# Patient Record
Sex: Female | Born: 1965 | Race: White | Hispanic: No | Marital: Single | Smoking: Never smoker
Health system: Southern US, Community
[De-identification: ages and names within clinical notes are randomized; demographics above are authoritative.]

---

## 2018-12-31 ENCOUNTER — Encounter: Payer: Self-pay | Admitting: Family Medicine

## 2018-12-31 ENCOUNTER — Other Ambulatory Visit: Payer: Self-pay

## 2018-12-31 ENCOUNTER — Ambulatory Visit (INDEPENDENT_AMBULATORY_CARE_PROVIDER_SITE_OTHER): Payer: Managed Care, Other (non HMO) | Admitting: Family Medicine

## 2018-12-31 DIAGNOSIS — Z789 Other specified health status: Secondary | ICD-10-CM

## 2018-12-31 DIAGNOSIS — R635 Abnormal weight gain: Secondary | ICD-10-CM | POA: Diagnosis not present

## 2018-12-31 NOTE — Progress Notes (Signed)
Patient ID: Hamdi Vari, female   DOB: 1965-10-28, 53 y.o.   MRN: [id number]    Virtual Visit via video Note  This visit type was conducted due to national recommendations for restrictions regarding the COVID-19 pandemic (e.g. social distancing).  This format is felt to be most appropriate for this patient at this time.  All issues noted in this document were discussed and addressed.  No physical exam was performed (except for noted visual exam findings with Video Visits).   I connected with Debera Lat today at 10:00 AM EDT by a video enabled telemedicine application and verified that I am speaking with the correct person using two identifiers. Location patient: home Location provider: work or home office Persons participating in the virtual visit: patient, provider  I discussed the limitations, risks, security and privacy concerns of performing an evaluation and management service by telephone and the availability of in person appointments. I also discussed with the patient that there may be a patient responsible charge related to this service. The patient expressed understanding and agreed to proceed.  HPI:  Patient and I connected via video prior to establish with PCP.  Main concern is unexplained weight gain.  States she has gained 40 pounds even though she is a healthy diet.  She is a vegetarian and has been for more than 10 years.  Follows a healthy vegetarian diet with lots of beans, various fruits and vegetables, tofu.  Does eat eggs at times as well.  Tries to avoid junk foods and foods that are overly processed.  States she did used to run marathons and no longer does this, this could contribute to her weight gain however does still try to get in workouts at least a few times per week.  Patient is concerned her thyroid may be underactive.   ROS:  Constitutional: Negative for chills, fatigue and fever.  HENT: Negative for congestion, ear pain, sinus pain and sore throat.   Eyes:  Negative.   Respiratory: Negative for cough, shortness of breath and wheezing.   Cardiovascular: Negative for chest pain, palpitations and leg swelling.  Gastrointestinal: Negative for abdominal pain, diarrhea, nausea and vomiting.  Genitourinary: Negative for dysuria, frequency and urgency.  Musculoskeletal: Negative for arthralgias and myalgias.  Skin: Negative for color change, pallor and rash.  Neurological: Negative for syncope, light-headedness and headaches.  Psychiatric/Behavioral: The patient is not nervous/anxious.     Past medical, surgical, social and family history reviewed and updated accordingly in chart.  History reviewed. No pertinent past medical history.  History reviewed. No pertinent surgical history.  Family History  Problem Relation Age of Onset  . Hypertension Mother   . Cancer Mother        Dwain Sarna and Lymph Node  . Cancer Father     Social History   Tobacco Use  . Smoking status: Never Smoker  . Smokeless tobacco: Never Used  Substance Use Topics  . Alcohol use: Yes    Comment: ocassionally   No current outpatient medications on file.  EXAM:  GENERAL: alert, oriented, appears well and in no acute distress  HEENT: atraumatic, conjunttiva clear, no obvious abnormalities on inspection of external nose and ears  NECK: normal movements of the head and neck  LUNGS: on inspection no signs of respiratory distress, breathing rate appears normal, no obvious gross SOB, gasping or wheezing  CV: no obvious cyanosis  MS: moves all visible extremities without noticeable abnormality  PSYCH/NEURO: pleasant and cooperative, no obvious depression or anxiety, speech  and thought processing grossly intact  ASSESSMENT AND PLAN:  Discussed the following assessment and plan:  Unexplained weight gain-patient will come into office for complete physical exam and we will do blood work at that time to do general screening and also look for possible causes  of unexplained weight gain such as thyroid being underactive, vitamin deficiency, electrolyte imbalances.  Also discussed more physical activity, patient used to be extremely physically active and running marathons; no longer running marathons could potentially have contributed to her weight gain.   I discussed the assessment and treatment plan with the patient. The patient was provided an opportunity to ask questions and all were answered. The patient agreed with the plan and demonstrated an understanding of the instructions.   The patient was advised to call back or seek an in-person evaluation if the symptoms worsen or if the condition fails to improve as anticipated.  I provided 20 minutes of video-face-to-face time during this encounter.   Jodelle Green, FNP

## 2019-01-02 DIAGNOSIS — R635 Abnormal weight gain: Secondary | ICD-10-CM | POA: Insufficient documentation

## 2019-01-02 DIAGNOSIS — Z789 Other specified health status: Secondary | ICD-10-CM | POA: Insufficient documentation

## 2019-01-14 ENCOUNTER — Ambulatory Visit: Payer: Self-pay | Admitting: Family Medicine

## 2019-01-28 ENCOUNTER — Ambulatory Visit: Payer: Self-pay | Admitting: Family Medicine

## 2019-01-28 DIAGNOSIS — Z0289 Encounter for other administrative examinations: Secondary | ICD-10-CM

## 2019-04-01 ENCOUNTER — Encounter: Payer: Self-pay | Admitting: Family Medicine

## 2019-04-01 ENCOUNTER — Ambulatory Visit (INDEPENDENT_AMBULATORY_CARE_PROVIDER_SITE_OTHER): Payer: Managed Care, Other (non HMO) | Admitting: Family Medicine

## 2019-04-01 ENCOUNTER — Other Ambulatory Visit: Payer: Self-pay

## 2019-04-01 VITALS — BP 136/90 | HR 84 | Temp 97.9°F | Ht 69.5 in | Wt 284.8 lb

## 2019-04-01 DIAGNOSIS — Z789 Other specified health status: Secondary | ICD-10-CM | POA: Diagnosis not present

## 2019-04-01 DIAGNOSIS — Z0000 Encounter for general adult medical examination without abnormal findings: Secondary | ICD-10-CM

## 2019-04-01 DIAGNOSIS — R635 Abnormal weight gain: Secondary | ICD-10-CM

## 2019-04-01 DIAGNOSIS — R030 Elevated blood-pressure reading, without diagnosis of hypertension: Secondary | ICD-10-CM | POA: Diagnosis not present

## 2019-04-01 DIAGNOSIS — Z23 Encounter for immunization: Secondary | ICD-10-CM | POA: Diagnosis not present

## 2019-04-01 DIAGNOSIS — Z1231 Encounter for screening mammogram for malignant neoplasm of breast: Secondary | ICD-10-CM

## 2019-04-01 DIAGNOSIS — H9313 Tinnitus, bilateral: Secondary | ICD-10-CM | POA: Diagnosis not present

## 2019-04-01 DIAGNOSIS — Z1211 Encounter for screening for malignant neoplasm of colon: Secondary | ICD-10-CM

## 2019-04-01 LAB — CBC
HCT: 42.3 % (ref 36.0–46.0)
Hemoglobin: 14 g/dL (ref 12.0–15.0)
MCHC: 33.1 g/dL (ref 30.0–36.0)
MCV: 87.6 fl (ref 78.0–100.0)
Platelets: 322 10*3/uL (ref 150.0–400.0)
RBC: 4.83 Mil/uL (ref 3.87–5.11)
RDW: 14.2 % (ref 11.5–15.5)
WBC: 7.3 10*3/uL (ref 4.0–10.5)

## 2019-04-01 LAB — COMPREHENSIVE METABOLIC PANEL
ALT: 16 U/L (ref 0–35)
AST: 12 U/L (ref 0–37)
Albumin: 3.9 g/dL (ref 3.5–5.2)
Alkaline Phosphatase: 80 U/L (ref 39–117)
BUN: 10 mg/dL (ref 6–23)
CO2: 29 mEq/L (ref 19–32)
Calcium: 9.1 mg/dL (ref 8.4–10.5)
Chloride: 103 mEq/L (ref 96–112)
Creatinine, Ser: 0.63 mg/dL (ref 0.40–1.20)
GFR: 98.25 mL/min (ref >60.00)
Glucose, Bld: 107 mg/dL — ABNORMAL HIGH (ref 70–99)
Potassium: 4.3 mEq/L (ref 3.5–5.1)
Sodium: 140 mEq/L (ref 135–145)
Total Bilirubin: 0.4 mg/dL (ref 0.2–1.2)
Total Protein: 6.4 g/dL (ref 6.0–8.3)

## 2019-04-01 LAB — LIPID PANEL
Cholesterol: 154 mg/dL (ref 0–200)
HDL: 39.9 mg/dL (ref >39.00)
LDL Cholesterol: 87 mg/dL (ref 0–99)
NonHDL: 113.96
Total CHOL/HDL Ratio: 4
Triglycerides: 135 mg/dL (ref 0.0–149.0)
VLDL: 27 mg/dL (ref 0.0–40.0)

## 2019-04-01 LAB — B12 AND FOLATE PANEL
Folate: 14.2 ng/mL (ref >5.9)
Vitamin B-12: 429 pg/mL (ref 211–911)

## 2019-04-01 LAB — VITAMIN D 25 HYDROXY (VIT D DEFICIENCY, FRACTURES): VITD: 29.74 ng/mL — ABNORMAL LOW (ref 30.00–100.00)

## 2019-04-01 LAB — TSH: TSH: 1.34 u[IU]/mL (ref 0.35–4.50)

## 2019-04-01 NOTE — Progress Notes (Signed)
Subjective:    Patient ID: Holly Maynard, female    DOB: Sep 15, 1965, 53 y.o.   MRN: [id number]  HPI   Patient presents to clinic for physical exam and also to discuss unexplained weight gain, ringing in ears.  Patient states she is been a vegetarian for many years, but over the past year or so she has gained 40 pounds and does not know why.  States she was running marathons years ago, but she was sidelined due to plantar fasciitis and did gain some weight after that, she went from 135 to 175 after stopping marathons & did go over 200 pounds when she began working out less.  But over the past year, has been more conscious of calories and water intake, but continues to gain.   Patient has noticed ringing in ears more so over the last year.  Denies any injury to the ears.  States she did hit her head a few years back, but hearing seemed fine after that.  Denies ever being around loud machinery; has never served in [employer]  She will be seeing GYN for Pap smear.  Has not had a mammogram in many years.  Declines colonoscopy, but is open to Cologuard.  Sees eye doctor annually, sees dentist every 6 months.   Patient Active Problem List   Diagnosis Date Noted  . Vegetarian diet 01/02/2019  . Unexplained weight gain 01/02/2019   Social History   Tobacco Use  . Smoking status: Never Smoker  . Smokeless tobacco: Never Used  Substance Use Topics  . Alcohol use: Yes    Comment: ocassionally   History reviewed. No pertinent surgical history.   Family History  Problem Relation Age of Onset  . Hypertension Mother   . Cancer Mother        Dwain Sarna and Lymph Node  . Cancer Father      Review of Systems  Constitutional: Negative for chills, fatigue and fever.  HENT: Negative for congestion, sinus pain and sore throat. +ringing in ears   Eyes: Negative.   Respiratory: Negative for cough, shortness of breath and wheezing.   Cardiovascular: Negative for chest pain, palpitations  and leg swelling.  Gastrointestinal: Negative for abdominal pain, diarrhea, nausea and vomiting.  Genitourinary: Negative for dysuria, frequency and urgency.  Musculoskeletal: Negative for arthralgias and myalgias.  Skin: Negative for color change, pallor and rash.  Neurological: Negative for syncope, light-headedness and headaches.  Psychiatric/Behavioral: The patient is not nervous/anxious.       Objective:   Physical Exam Vitals signs and nursing note reviewed.  Constitutional:      General: She is not in acute distress.    Appearance: She is obese. She is not ill-appearing, toxic-appearing or diaphoretic.  HENT:     Head: Normocephalic and atraumatic.     Right Ear: Tympanic membrane, ear canal and external ear normal. There is no impacted cerumen.     Left Ear: Tympanic membrane, ear canal and external ear normal. There is no impacted cerumen.  Eyes:     General: No scleral icterus.    Extraocular Movements: Extraocular movements intact.     Conjunctiva/sclera: Conjunctivae normal.     Pupils: Pupils are equal, round, and reactive to light.  Neck:     Musculoskeletal: Normal range of motion and neck supple.  Cardiovascular:     Rate and Rhythm: Normal rate and regular rhythm.     Heart sounds: Normal heart sounds.  Pulmonary:     Effort: Pulmonary effort is  normal. No respiratory distress.     Breath sounds: Normal breath sounds.  Abdominal:     General: Bowel sounds are normal. There is no distension.     Palpations: Abdomen is soft. There is no mass.     Tenderness: There is no abdominal tenderness. There is no right CVA tenderness, left CVA tenderness, guarding or rebound.     Hernia: No hernia is present.  Musculoskeletal: Normal range of motion.     Right lower leg: No edema.     Left lower leg: No edema.  Skin:    General: Skin is warm and dry.     Capillary Refill: Capillary refill takes less than 2 seconds.     Coloration: Skin is not jaundiced or pale.   Neurological:     General: No focal deficit present.     Mental Status: She is alert and oriented to person, place, and time.     Cranial Nerves: No cranial nerve deficit.     Gait: Gait normal.  Psychiatric:        Mood and Affect: Mood normal.        Behavior: Behavior normal.    Today's Vitals   04/01/19 0808 04/01/19 0834  BP: (!) 140/100 136/90  Pulse: 84   Temp: 97.9 F (36.6 C)   TempSrc: Temporal   SpO2: 98%   Weight: 284 lb 12.8 oz (129.2 kg)   Height: 5' 9.5" (1.765 m)    Body mass index is 41.45 kg/m.     Assessment & Plan:    Well adult health check - Plan: CBC, Comp Met (CMET), TSH, Lipid panel, B12 and Folate Panel, VITAMIN D 25 Hydroxy (Vit-D Deficiency, Fractures)  Vegetarian diet - Plan: B12 and Folate Panel, VITAMIN D 25 Hydroxy (Vit-D Deficiency, Fractures)  Screening mammogram, encounter for - Plan: MM Digital Screening  Colon cancer screening - Plan: Cologuard  Need for immunization against influenza - Plan: Flu Vaccine QUAD 36+ mos IM  Tinnitus of both ears - Plan: Ambulatory referral to Audiology  Elevated BP without diagnosis of hypertension  Unexplained weight gain  We will get lab work for general annual screening as well as further investigation to unexplained weight gain.  Patient will get mammogram.  She will do Cologuard for colon cancer screening, declines colonoscopy.  Will be getting Pap smear via GYN.  Flu vaccine given in clinic.  We will refer to audiology for further investigation into humming/ringing in ears.  Reviewed healthy diet and regular physical activity, recommended diet full of good proteins, lots of vegetables and fruits, avoiding excess carbohydrates and sugars and good water intake.  Also encouraged regular physical activity of walking and lifting small weights.  She sees eye doctor and dentist regularly.  Always wear seatbelt in car.  Discussed safe sun practices and wearing SPF when outdoors.     Patient will return  in clinic in 2 weeks for recheck of blood pressure due to elevated reading without diagnosis of hypertension.

## 2019-04-05 ENCOUNTER — Telehealth: Payer: Self-pay | Admitting: *Deleted

## 2019-04-05 NOTE — Telephone Encounter (Signed)
I advised patient not to worry & I would call her when they were resulted to me.

## 2019-04-05 NOTE — Telephone Encounter (Signed)
Copied from [place] [id number]. Topic: General - Other >> Apr 05, 2019  2:55 PM Carolyn Stare wrote: Pt would like a call back about labs

## 2019-04-05 NOTE — Telephone Encounter (Signed)
Patient was seen Friday 11/6  I am getting to her labs, there is nothing urgent  For annual labs, 3 business days for resulting is normal  If anything ever is urgent or emergent patient will be notified immediately

## 2019-04-06 ENCOUNTER — Encounter: Payer: Self-pay | Admitting: Family Medicine

## 2021-04-25 ENCOUNTER — Encounter: Payer: Self-pay | Admitting: Adult Health

## 2021-04-25 ENCOUNTER — Ambulatory Visit: Payer: Managed Care, Other (non HMO) | Admitting: Adult Health

## 2021-04-25 ENCOUNTER — Other Ambulatory Visit: Payer: Self-pay

## 2021-04-25 ENCOUNTER — Ambulatory Visit (INDEPENDENT_AMBULATORY_CARE_PROVIDER_SITE_OTHER): Payer: Managed Care, Other (non HMO)

## 2021-04-25 VITALS — BP 148/98 | HR 85 | Temp 98.0°F | Resp 18 | Ht 69.0 in | Wt 301.2 lb

## 2021-04-25 DIAGNOSIS — G8929 Other chronic pain: Secondary | ICD-10-CM

## 2021-04-25 DIAGNOSIS — Z789 Other specified health status: Secondary | ICD-10-CM | POA: Diagnosis not present

## 2021-04-25 DIAGNOSIS — M7502 Adhesive capsulitis of left shoulder: Secondary | ICD-10-CM | POA: Insufficient documentation

## 2021-04-25 DIAGNOSIS — R635 Abnormal weight gain: Secondary | ICD-10-CM | POA: Diagnosis not present

## 2021-04-25 DIAGNOSIS — M25512 Pain in left shoulder: Secondary | ICD-10-CM

## 2021-04-25 NOTE — Progress Notes (Signed)
Subjective:    Patient ID: Holly Maynard, female    DOB: 11/19/1965, 55 y.o.   MRN: [id number]  Shoulder Pain  The pain is present in the left shoulder (pain with side movements of arm.). This is a new problem. Episode onset: one year seemed to improved, then got worse in this past summer when she was pushing on a table. There has been a history of trauma. The problem occurs intermittently. The problem has been gradually worsening. The quality of the pain is described as aching. The pain is at a severity of 4/10. The pain is mild. Associated symptoms include a limited range of motion. Pertinent negatives include no fever, inability to bear weight, itching, joint locking, joint swelling, numbness, stiffness or tingling. The symptoms are aggravated by activity (sits at desk typing.). The treatment provided moderate relief. Family history does not include gout or rheumatoid arthritis. There is no history of diabetes, gout, osteoarthritis or rheumatoid arthritis.  She does report that she hears a popping in her shoulder at times.  Denies any other injury to shoulder. Denies any neck pain or neck injury.  Patient denies any breast changes or breast lumps.  Denies any pain under her arm.  She has no cardiac symptoms to report. Patient also has a chronic concern of unexplained weight gain since her 29s. Patient  denies any fever, body aches,chills, rash, chest pain, shortness of breath, nausea, vomiting, or diarrhea.     Review of Systems  Constitutional: Negative.  Negative for activity change and fever.  HENT: Negative.    Respiratory: Negative.    Cardiovascular: Negative.   Gastrointestinal: Negative.   Genitourinary: Negative.   Musculoskeletal:  Positive for arthralgias. Negative for back pain, gait problem, gout, joint swelling, myalgias, neck pain, neck stiffness and stiffness.  Skin: Negative.  Negative for itching.  Neurological: Negative.  Negative for tingling and numbness.   Psychiatric/Behavioral: Negative.        Objective:   Physical Exam Constitutional:      General: She is not in acute distress.    Appearance: She is obese. She is not ill-appearing, toxic-appearing or diaphoretic.  HENT:     Right Ear: External ear normal.     Left Ear: External ear normal.     Nose: Nose normal.     Mouth/Throat:     Mouth: Mucous membranes are moist.  Eyes:     Conjunctiva/sclera: Conjunctivae normal.  Cardiovascular:     Rate and Rhythm: Normal rate and regular rhythm.     Pulses: Normal pulses.     Heart sounds: Normal heart sounds. No murmur heard.   No friction rub. No gallop.  Abdominal:     Palpations: Abdomen is soft.  Musculoskeletal:        General: Tenderness present. No swelling or signs of injury.     Right shoulder: Normal. Normal pulse.     Left shoulder: Tenderness present. No swelling, deformity, effusion, laceration, bony tenderness or crepitus. Decreased range of motion (lateral extension/ rotation is decreased). Normal strength. Normal pulse.     Right upper arm: Normal.     Left upper arm: Normal.     Right lower leg: No edema.     Left lower leg: No edema.  Skin:    Findings: No erythema or rash.  Neurological:     Mental Status: She is alert and oriented to person, place, and time.  Psychiatric:        Mood and Affect: Mood normal.  Behavior: Behavior normal.        Thought Content: Thought content normal.        Judgment: Judgment normal.          Assessment & Plan:   Chronic left shoulder pain - Plan: DG Shoulder Left  Vegetarian diet - Plan: VITAMIN D 25 Hydroxy (Vit-D Deficiency, Fractures), Lipid panel, B12 and Folate Panel  Unexplained weight gain - Plan: CBC with Differential/Platelet, Comprehensive metabolic panel, TSH, Hemoglobin A1c, Lipid panel  Given chronicity will consider referral to orthopedics for further work-up of chronic left shoulder pain, patient would like to wait till after x-ray to have  this done.  Patient will also schedule labs that were ordered for a transfer of care to this provider that she was previously seeing Lorine Bears who has left the practice.  Patient is advised to schedule this in the near future.  She would like to discuss weight gain as well possibly at the next visit.  Red Flags discussed. The patient was given clear instructions to go to ER or return to medical center if any red flags develop, symptoms do not improve, worsen or new problems develop. They verbalized understanding.  Advised patient call the office or your primary care doctor for an appointment if no improvement within 72 hours or if any symptoms change or worsen at any time  Advised ER or urgent Care if after hours or on weekend. Call 911 for emergency symptoms at any time.Patinet verbalized understanding of all instructions given/reviewed and treatment plan and has no further questions or concerns at this time.    Return if symptoms worsen or fail to improve, for at any time for any worsening symptoms, Go to Emergency room/ urgent care if worse.

## 2021-04-25 NOTE — Patient Instructions (Signed)
Shoulder Pain °Many things can cause shoulder pain, including: °An injury to the shoulder. °Overuse of the shoulder. °Arthritis. °The source of the pain can be: °Inflammation. °An injury to the shoulder joint. °An injury to a tendon, ligament, or bone. °Follow these instructions at home: °Pay attention to changes in your symptoms. Let your health care provider know about them. Follow these instructions to relieve your pain. °If you have a sling: °Wear the sling as told by your health care provider. Remove it only as told by your health care provider. °Loosen the sling if your fingers tingle, become numb, or turn cold and blue. °Keep the sling clean. °If the sling is not waterproof: °Do not let it get wet. Remove it to shower or bathe. °Move your arm as little as possible, but keep your hand moving to prevent swelling. °Managing pain, stiffness, and swelling ° °If directed, put ice on the painful area: °Put ice in a plastic bag. °Place a towel between your skin and the bag. °Leave the ice on for 20 minutes, 2-3 times per day. Stop applying ice if it does not help with the pain. °Squeeze a soft ball or a foam pad as much as possible. This helps to keep the shoulder from swelling. It also helps to strengthen the arm. °General instructions °Take over-the-counter and prescription medicines only as told by your health care provider. °Keep all follow-up visits as told by your health care provider. This is important. °Contact a health care provider if: °Your pain gets worse. °Your pain is not relieved with medicines. °New pain develops in your arm, hand, or fingers. °Get help right away if: °Your arm, hand, or fingers: °Tingle. °Become numb. °Become swollen. °Become painful. °Turn white or blue. °Summary °Shoulder pain can be caused by an injury, overuse, or arthritis. °Pay attention to changes in your symptoms. Let your health care provider know about them. °This condition may be treated with a sling, ice, and pain  medicines. °Contact your health care provider if the pain gets worse or new pain develops. Get help right away if your arm, hand, or fingers tingle or become numb, swollen, or painful. °Keep all follow-up visits as told by your health care provider. This is important. °This information is not intended to replace advice given to you by your health care provider. Make sure you discuss any questions you have with your health care provider. °Document Revised: 11/24/2017 Document Reviewed: 11/24/2017 °Elsevier Patient Education © 2022 [employer] ° °

## 2021-04-29 ENCOUNTER — Other Ambulatory Visit: Payer: Self-pay | Admitting: Adult Health

## 2021-04-29 DIAGNOSIS — M25512 Pain in left shoulder: Secondary | ICD-10-CM

## 2021-04-29 DIAGNOSIS — G8929 Other chronic pain: Secondary | ICD-10-CM

## 2021-04-29 DIAGNOSIS — M19012 Primary osteoarthritis, left shoulder: Secondary | ICD-10-CM

## 2021-04-29 NOTE — Progress Notes (Signed)
Orders Placed This Encounter Procedures  Ambulatory referral to Orthopedic Surgery   Referral Priority:   Routine   Referral Type:   Surgical   Referral Reason:   Specialty Services Required   Referred to Provider:   Justice Britain, MD   Requested Specialty:   Orthopedic Surgery   Number of Visits Requested:   1

## 2021-04-29 NOTE — Progress Notes (Signed)
Orders Placed This Encounter  Procedures   Ambulatory referral to Orthopedic Surgery    Referral Priority:   Routine    Referral Type:   Surgical    Referral Reason:   Specialty Services Required    Referred to Provider:   Justice Britain, MD    Requested Specialty:   Orthopedic Surgery    Number of Visits Requested:   1

## 2021-05-02 ENCOUNTER — Ambulatory Visit (INDEPENDENT_AMBULATORY_CARE_PROVIDER_SITE_OTHER): Payer: Managed Care, Other (non HMO) | Admitting: Adult Health

## 2021-05-02 ENCOUNTER — Other Ambulatory Visit: Payer: Self-pay

## 2021-05-02 ENCOUNTER — Encounter: Payer: Self-pay | Admitting: Adult Health

## 2021-05-02 VITALS — BP 132/90 | HR 68 | Temp 98.6°F | Ht 69.0 in | Wt 296.4 lb

## 2021-05-02 DIAGNOSIS — M25512 Pain in left shoulder: Secondary | ICD-10-CM

## 2021-05-02 DIAGNOSIS — I159 Secondary hypertension, unspecified: Secondary | ICD-10-CM

## 2021-05-02 DIAGNOSIS — Z6841 Body Mass Index (BMI) 40.0 and over, adult: Secondary | ICD-10-CM

## 2021-05-02 DIAGNOSIS — I10 Essential (primary) hypertension: Secondary | ICD-10-CM | POA: Diagnosis not present

## 2021-05-02 DIAGNOSIS — G8929 Other chronic pain: Secondary | ICD-10-CM | POA: Diagnosis not present

## 2021-05-02 DIAGNOSIS — Z789 Other specified health status: Secondary | ICD-10-CM

## 2021-05-02 DIAGNOSIS — R635 Abnormal weight gain: Secondary | ICD-10-CM | POA: Diagnosis not present

## 2021-05-02 LAB — CBC WITH DIFFERENTIAL/PLATELET
Basophils Absolute: 0.1 10*3/uL (ref 0.0–0.1)
Basophils Relative: 0.8 % (ref 0.0–3.0)
Eosinophils Absolute: 0.1 10*3/uL (ref 0.0–0.7)
Eosinophils Relative: 1.2 % (ref 0.0–5.0)
HCT: 44.1 % (ref 36.0–46.0)
Hemoglobin: 14.5 g/dL (ref 12.0–15.0)
Lymphocytes Relative: 21.6 % (ref 12.0–46.0)
Lymphs Abs: 1.7 10*3/uL (ref 0.7–4.0)
MCHC: 32.8 g/dL (ref 30.0–36.0)
MCV: 86.9 fl (ref 78.0–100.0)
Monocytes Absolute: 0.5 10*3/uL (ref 0.1–1.0)
Monocytes Relative: 6.3 % (ref 3.0–12.0)
Neutro Abs: 5.6 10*3/uL (ref 1.4–7.7)
Neutrophils Relative %: 70.1 % (ref 43.0–77.0)
Platelets: 364 10*3/uL (ref 150.0–400.0)
RBC: 5.08 Mil/uL (ref 3.87–5.11)
RDW: 14.2 % (ref 11.5–15.5)
WBC: 8 10*3/uL (ref 4.0–10.5)

## 2021-05-02 LAB — LIPID PANEL
Cholesterol: 154 mg/dL (ref 0–200)
HDL: 42.8 mg/dL (ref >39.00)
LDL Cholesterol: 82 mg/dL (ref 0–99)
NonHDL: 111.4
Total CHOL/HDL Ratio: 4
Triglycerides: 147 mg/dL (ref 0.0–149.0)
VLDL: 29.4 mg/dL (ref 0.0–40.0)

## 2021-05-02 LAB — VITAMIN D 25 HYDROXY (VIT D DEFICIENCY, FRACTURES): VITD: 25.87 ng/mL — ABNORMAL LOW (ref 30.00–100.00)

## 2021-05-02 LAB — COMPREHENSIVE METABOLIC PANEL
ALT: 19 U/L (ref 0–35)
AST: 15 U/L (ref 0–37)
Albumin: 4.2 g/dL (ref 3.5–5.2)
Alkaline Phosphatase: 83 U/L (ref 39–117)
BUN: 12 mg/dL (ref 6–23)
CO2: 30 mEq/L (ref 19–32)
Calcium: 10 mg/dL (ref 8.4–10.5)
Chloride: 104 mEq/L (ref 96–112)
Creatinine, Ser: 0.58 mg/dL (ref 0.40–1.20)
GFR: 100.97 mL/min (ref >60.00)
Glucose, Bld: 104 mg/dL — ABNORMAL HIGH (ref 70–99)
Potassium: 4.7 mEq/L (ref 3.5–5.1)
Sodium: 140 mEq/L (ref 135–145)
Total Bilirubin: 0.4 mg/dL (ref 0.2–1.2)
Total Protein: 6.6 g/dL (ref 6.0–8.3)

## 2021-05-02 LAB — B12 AND FOLATE PANEL
Folate: 16.4 ng/mL (ref >5.9)
Vitamin B-12: 882 pg/mL (ref 211–911)

## 2021-05-02 LAB — HEMOGLOBIN A1C: Hgb A1c MFr Bld: 6.1 % (ref 4.6–6.5)

## 2021-05-02 LAB — TSH: TSH: 1.2 u[IU]/mL (ref 0.35–5.50)

## 2021-05-02 MED ORDER — HYDROCHLOROTHIAZIDE 12.5 MG PO CAPS: 12.5000 mg | ORAL_CAPSULE | Freq: Every day | ORAL | 1 refills | Status: DC

## 2021-05-02 NOTE — Progress Notes (Signed)
Established Patient Office Visit  Subjective:  Patient ID: Holly Maynard, female    DOB: 1966-03-10  Age: 55 y.o. MRN: [id number]  CC:  Chief Complaint  Patient presents with   Establish Care    HPI Holly Maynard presents for  transfer of care from [facility] follow up chronic shoulder follow up, degenerative change in shoulder , pain for 2 years, referred to orthopedics last visit.   She is concerned with her weight. Difficult with weight loss since the age of 59's. Tired and weight hurts her legs/ knees. Not exercising due to fatigue and tiredness. She would like to try medication as she has tried multiple diets without success. The weight is hindering from exercising.    Mammogram and PAP at [facility] in [place]. Menopausal 1.5 years ago.  Cologuard 04/01/2019 results negative per patient, I can not see this.   Patient  denies any fever, body aches,chills, rash, chest pain, shortness of breath, nausea, vomiting, or diarrhea.   Denies dizziness, lightheadedness, pre syncopal or syncopal episodes.   No past medical history on file.  No past surgical history on file.  Family History  Problem Relation Age of Onset   Hypertension Mother    Cancer Mother        Dwain Sarna and Lymph Node   Cancer Father     Social History   Socioeconomic History   Marital status: Single    Spouse name: Not on file   Number of children: Not on file   Years of education: Not on file   Highest education level: Not on file  Occupational History   Not on file  Tobacco Use   Smoking status: Never   Smokeless tobacco: Never  Vaping Use   Vaping Use: Never used  Substance and Sexual Activity   Alcohol use: Yes    Comment: ocassionally   Drug use: Never   Sexual activity: Not on file  Other Topics Concern   Not on file  Social History Narrative   Not on file   Social Determinants of Health   Financial Resource Strain: Not on file  Food Insecurity: Not on file  Transportation Needs: Not  on file  Physical Activity: Not on file  Stress: Not on file  Social Connections: Not on file  Intimate Partner Violence: Not on file    No outpatient medications prior to visit.   No facility-administered medications prior to visit.    Allergies  Allergen Reactions   Benzonatate Anaphylaxis    Felt like she couldn't breathe    ROS Review of Systems  Constitutional:  Positive for fatigue. Negative for activity change, appetite change, chills, diaphoresis, fever and unexpected weight change.  HENT: Negative.    Respiratory: Negative.    Cardiovascular: Negative.   Gastrointestinal: Negative.   Genitourinary: Negative.   Musculoskeletal:  Positive for arthralgias. Negative for back pain, gait problem, joint swelling, myalgias, neck pain and neck stiffness.  Skin: Negative.   Neurological: Negative.   Psychiatric/Behavioral: Negative.       Objective:    Physical Exam Constitutional:      General: She is not in acute distress.    Appearance: She is obese. She is not ill-appearing, toxic-appearing or diaphoretic.  HENT:     Head: Normocephalic and atraumatic.     Right Ear: External ear normal.     Left Ear: External ear normal.     Nose: Nose normal.     Mouth/Throat:     Mouth: Mucous membranes  are moist.  Eyes:     Conjunctiva/sclera: Conjunctivae normal.  Neck:     Vascular: No carotid bruit.  Cardiovascular:     Rate and Rhythm: Normal rate and regular rhythm.     Pulses: Normal pulses.     Heart sounds: Normal heart sounds. No murmur heard.   No friction rub. No gallop.  Pulmonary:     Effort: Pulmonary effort is normal. No respiratory distress.     Breath sounds: Normal breath sounds. No stridor. No wheezing, rhonchi or rales.  Chest:     Chest wall: No tenderness.  Abdominal:     Palpations: Abdomen is soft.  Musculoskeletal:     Cervical back: Normal range of motion and neck supple. No rigidity or tenderness.     Comments: left shoulder chronic  pain, x ray showed mild degenerative changes has been referred to orthopedics for further evaluation. Denies any worsening symptoms.    Lymphadenopathy:     Cervical: No cervical adenopathy.  Skin:    General: Skin is warm.     Findings: No erythema or rash.  Neurological:     General: No focal deficit present.     Mental Status: She is alert and oriented to person, place, and time.     Motor: No weakness.     Gait: Gait normal.  Psychiatric:        Mood and Affect: Mood normal.        Behavior: Behavior normal.        Thought Content: Thought content normal.    BP 132/90 (BP Location: Right Arm, Patient Position: Sitting, Cuff Size: Large)   Pulse 68   Temp 98.6 F (37 C) (Oral)   Ht 5\' 9"  (1.753 m)   Wt 296 lb 6.4 oz (134.4 kg)   LMP  (LMP Unknown)   SpO2 97%   BMI 43.77 kg/m  Wt Readings from Last 3 Encounters:  05/02/21 296 lb 6.4 oz (134.4 kg)  04/25/21 (!) 301 lb 4 oz (136.6 kg)  04/01/19 284 lb 12.8 oz (129.2 kg)   Vitals with BMI 05/02/2021 04/25/2021 04/01/2019  Height 5\' 9"  5\' 9"  -  Weight 296 lbs 6 oz 301 lbs 4 oz -  BMI 12.19 75.88 -  Systolic 325 498 264  Diastolic 90 98 90  Pulse 68 85 -     Health Maintenance Due  Topic Date Due   PAP SMEAR-Modifier  Never done   COLONOSCOPY (Pts 45-30yrs Insurance coverage will need to be confirmed)  Never done   MAMMOGRAM  Never done    There are no preventive care reminders to display for this patient.  Lab Results  Component Value Date   TSH 1.34 04/01/2019   Lab Results  Component Value Date   WBC 7.3 04/01/2019   HGB 14.0 04/01/2019   HCT 42.3 04/01/2019   MCV 87.6 04/01/2019   PLT 322.0 04/01/2019   Lab Results  Component Value Date   NA 140 04/01/2019   K 4.3 04/01/2019   CO2 29 04/01/2019   GLUCOSE 107 (H) 04/01/2019   BUN 10 04/01/2019   CREATININE 0.63 04/01/2019   BILITOT 0.4 04/01/2019   ALKPHOS 80 04/01/2019   AST 12 04/01/2019   ALT 16 04/01/2019   PROT 6.4 04/01/2019   ALBUMIN 3.9  04/01/2019   CALCIUM 9.1 04/01/2019   GFR 98.25 04/01/2019   Lab Results  Component Value Date   CHOL 154 04/01/2019   Lab Results  Component Value Date  HDL 39.90 04/01/2019   Lab Results  Component Value Date   LDLCALC 87 04/01/2019   Lab Results  Component Value Date   TRIG 135.0 04/01/2019   Lab Results  Component Value Date   CHOLHDL 4 04/01/2019   No results found for: HGBA1C    Assessment & Plan:   Problem List Items Addressed This Visit       Other   Vegetarian diet   Unexplained weight gain   Chronic left shoulder pain   Body mass index (BMI) of 40.1-44.9 in adult [facility]) - Primary   Other Visit Diagnoses     Secondary hypertension       Relevant Medications   hydrochlorothiazide (MICROZIDE) 12.5 MG capsule   Primary hypertension       Relevant Medications   hydrochlorothiazide (MICROZIDE) 12.5 MG capsule       Meds ordered this encounter  Medications   hydrochlorothiazide (MICROZIDE) 12.5 MG capsule    Sig: Take 1 capsule (12.5 mg total) by mouth daily.    Dispense:  30 capsule    Refill:  1   Blood pressure has remained elevated the past 2 visits, patient will start low-dose hydrochlorothiazide.  Labs are today.  We will check labs that would be resulting in weight gain or inability to lose weight.  Discussed that if she is diabetic or prediabetic may qualify for Ozempic given information for that as well. We will wait for labs and make decision she has had multiple failed attempts at weight loss and weight is putting pressure on her knees.  Records have been requested, patient reports Cologuard 1 year ago was negative.  She does have women's health care that she is going to schedule her Pap and mammogram with.  Follow-up: Return in about 2 months (around 07/03/2021), or if symptoms worsen or fail to improve, for at any time for any worsening symptoms, Go to Emergency room/ urgent care if worse.    Marcille Buffy, FNP

## 2021-05-02 NOTE — Patient Instructions (Signed)
Aliskiren; Hydrochlorothiazide, HCTZ Tablet What is this medication? ALISKIREN; HYDROCHLOROTHIAZIDE (a lis KYE ren; hye droe klor oh THYE a zide) is a combination of a renin inhibitor and a diuretic. It treats high blood pressure. This medicine may be used for other purposes; ask your health care provider or pharmacist if you have questions. COMMON BRAND NAME(S): Tekturna HCT What should I tell my care team before I take this medication? They need to know if you have any of these conditions: dehydration diabetes gout kidney disease or kidney stones liver disease pancreatitis small amount of urine or difficulty passing urine an unusual or allergic reaction to aliskiren, hydrochlorothiazide, HCTZ, other medicines, foods, dyes, or preservatives pregnant or trying to get pregnant breast-feeding How should I use this medication? Take this medicine by mouth with a glass of water. Follow the directions on your prescription label. You can take this medicine with or without food. However, you should always take it the same way each time. Take your medicine at regular intervals. Do not take it more often than directed. Do not stop taking except on your doctor's advice. Talk to your health care provider about the use of this drug in children. Special care may be needed. Overdosage: If you think you have taken too much of this medicine contact a poison control center or emergency room at once. NOTE: This medicine is only for you. Do not share this medicine with others. What if I miss a dose? If you miss a dose, take it as soon as you can. If it is almost time for your next dose, take only that dose. Do not take double or extra doses. What may interact with this medication? alcohol atorvastatin barbiturates cholestyramine colestipol digoxin dofetilide furosemide irbesartan lithium medicines for blood pressure medicines for diabetes medicines for fungal infections like  ketoconazole medicines that relax muscles for surgery narcotic medicines for pain NSAIDs, medicines for pain and inflammation, like ibuprofen or naproxen potassium supplements steroid medicines like prednisone or cortisone This list may not describe all possible interactions. Give your health care provider a list of all the medicines, herbs, non-prescription drugs, or dietary supplements you use. Also tell them if you smoke, drink alcohol, or use illegal drugs. Some items may interact with your medicine. What should I watch for while using this medication? Visit your health care provider for regular checks on your progress. Check your blood pressure as directed. Ask your health care provider what your blood pressure should be. Also, find out when you should contact him or her. Women should inform theirhealth care providerif they wish to become pregnant or think they might be pregnant. There is a potential for serious side effects to an unborn child, especially in the second or third trimester. Talk to your health careproviderfor more information. This drug may increase your blood sugar. Ask your health care provider if changes in diet or drugs are needed if you have diabetes. If you have diabetes and take a drug known as an ACE inhibitor or ARB, do not take this drug. Talk to your health care provider if questions. Do not treat yourself for coughs, colds, or pain while you are using this drug without asking your health care provider for advice. Some drugs may increase your blood pressure. Check with your doctor or health care provider if you get an attack of severe diarrhea, nausea, and vomiting, or if you sweat a lot. The loss of too much body fluid can make it dangerous for you to  take this drug. Avoid salt substitutes unless you are told otherwise by your health care provider. Talk to your health care provider about other dietary needs. You may get drowsy or dizzy. Do not drive, use machinery, or  do anything that needs mental alertness until you know how thisdrug affects you. Do not stand or sit up quickly, especially if you are an older patient. This reduces the risk of dizzy or fainting spells. Alcohol may interfere with the effect of this drug. Avoid alcoholic drinks. Talk to your health care provider about your risk of skin cancer. You may be more at risk for skin cancer if you take this drug. Thisdrug can make you more sensitive to the sun. Keep out of the sun. If you cannot avoid being in the sun, wear protective clothing, and use sunscreen. Do not use sun lamps or tanning beds/booths. What side effects may I notice from receiving this medication? Side effects that you should report to your doctor or health care professional as soon as possible: allergic reactions like skin rash or hives, swelling of the hands, feet, face, lips, throat, or tongue breathing problems changes in vision eye pain fast, irregular heartbeat feeling faint or lightheaded, falls fever or sore throat gout pain low blood pressure muscle pain or cramps pain, tingling, numbness in the hands or feet pain or difficulty passing urine redness, blistering, peeling or loosening of the skin, including inside the mouth seizures unusually weak or tired Side effects that usually do not require medical attention (report to your doctor or health care professional if they continue or are bothersome): change in sex drive or performance cough diarrhea dizziness dry mouth flu-like symptoms headache stomach upset This list may not describe all possible side effects. Call your doctor for medical advice about side effects. You may report side effects to FDA at 1-800-FDA-1088. Where should I keep my medication? Keep out of the reach of children and pets. Store at room temperature between 20 and 25 degrees C (68 and 77 degrees F). Protect from moisture. Keep the container tightly closed. Do not throw out the packet in the  container. It keeps the medicine dry. Throw away any unused drug after the expiration date. NOTE: This sheet is a summary. It may not cover all possible information. If you have questions about this medicine, talk to your doctor, pharmacist, or health care provider.  2022 Elsevier/Gold Standard (2021-01-29 00:00:00) Wegovy/ Semaglutide Injection (Weight Management) What is this medication? SEMAGLUTIDE (SEM a GLOO tide) promotes weight loss. It may also be used to maintain weight loss. It works by decreasing appetite. Changes to diet and exercise are often combined with this medication. This medicine may be used for other purposes; ask your health care provider or pharmacist if you have questions. COMMON BRAND NAME(S): YWVPXT What should I tell my care team before I take this medication? They need to know if you have any of these conditions: Endocrine tumors (MEN 2) or if someone in your family had these tumors Eye disease, vision problems Gallbladder disease History of depression or mental health disease History of pancreatitis Kidney disease Stomach or intestine problems Suicidal thoughts, plans, or attempt; a previous suicide attempt by you or a family member Thyroid cancer or if someone in your family had thyroid cancer An unusual or allergic reaction to semaglutide, other medications, foods, dyes, or preservatives Pregnant or trying to get pregnant Breast-feeding How should I use this medication? This medication is injected under the skin. You will be taught  how to prepare and give it. Take it as directed on the prescription label. It is given once every week (every 7 days). Keep taking it unless your care team tells you to stop. It is important that you put your used needles and pens in a special sharps container. Do not put them in a trash can. If you do not have a sharps container, call your pharmacist or care team to get one. A special MedGuide will be given to you by the pharmacist  with each prescription and refill. Be sure to read this information carefully each time. This medication comes with INSTRUCTIONS FOR USE. Ask your pharmacist for directions on how to use this medication. Read the information carefully. Talk to your pharmacist or care team if you have questions. Talk to your care team about the use of this medication in children. Special care may be needed. Overdosage: If you think you have taken too much of this medicine contact a poison control center or emergency room at once. NOTE: This medicine is only for you. Do not share this medicine with others. What if I miss a dose? If you miss a dose and the next scheduled dose is more than 2 days away, take the missed dose as soon as possible. If you miss a dose and the next scheduled dose is less than 2 days away, do not take the missed dose. Take the next dose at your regular time. Do not take double or extra doses. If you miss your dose for 2 weeks or more, take the next dose at your regular time or call your care team to talk about how to restart this medication. What may interact with this medication? Insulin and other medications for diabetes This list may not describe all possible interactions. Give your health care provider a list of all the medicines, herbs, non-prescription drugs, or dietary supplements you use. Also tell them if you smoke, drink alcohol, or use illegal drugs. Some items may interact with your medicine. What should I watch for while using this medication? Visit your care team for regular checks on your progress. It may be some time before you see the benefit from this medication. Drink plenty of fluids while taking this medication. Check with your care team if you have severe diarrhea, nausea, and vomiting, or if you sweat a lot. The loss of too much body fluid may make it dangerous for you to take this medication. This medication may affect blood sugar levels. Ask your care team if changes in  diet or medications are needed if you have diabetes. If you or your family notice any changes in your behavior, such as new or worsening depression, thoughts of harming yourself, anxiety, other unusual or disturbing thoughts, or memory loss, call your care team right away. Women should inform their care team if they wish to become pregnant or think they might be pregnant. Losing weight while pregnant is not advised and may cause harm to the unborn child. Talk to your care team for more information. What side effects may I notice from receiving this medication? Side effects that you should report to your care team as soon as possible: Allergic reactions--skin rash, itching, hives, swelling of the face, lips, tongue, or throat Change in vision Dehydration--increased thirst, dry mouth, feeling faint or lightheaded, headache, dark yellow or brown urine Gallbladder problems--severe stomach pain, nausea, vomiting, fever Heart palpitations--rapid, pounding, or irregular heartbeat Kidney injury--decrease in the amount of urine, swelling of the ankles, hands,  or feet Pancreatitis--severe stomach pain that spreads to your back or gets worse after eating or when touched, fever, nausea, vomiting Thoughts of suicide or self-harm, worsening mood, feelings of depression Thyroid cancer--new mass or lump in the neck, pain or trouble swallowing, trouble breathing, hoarseness Side effects that usually do not require medical attention (report to your care team if they continue or are bothersome): Diarrhea Loss of appetite Nausea Stomach pain Vomiting This list may not describe all possible side effects. Call your doctor for medical advice about side effects. You may report side effects to FDA at 1-800-FDA-1088. Where should I keep my medication? Keep out of the reach of children and pets. Refrigeration (preferred): Store in the refrigerator. Do not freeze. Keep this medication in the original container until you  are ready to take it. Get rid of any unused medication after the expiration date. Room temperature: If needed, prior to cap removal, the pen can be stored at room temperature for up to 28 days. Protect from light. If it is stored at room temperature, get rid of any unused medication after 28 days or after it expires, whichever is first. It is important to get rid of the medication as soon as you no longer need it or it is expired. You can do this in two ways: Take the medication to a medication take-back program. Check with your pharmacy or law enforcement to find a location. If you cannot return the medication, follow the directions in the [place]. NOTE: This sheet is a summary. It may not cover all possible information. If you have questions about this medicine, talk to your doctor, pharmacist, or health care provider.  2022 Elsevier/Gold Standard (2020-08-17 00:00:00)

## 2021-05-03 ENCOUNTER — Other Ambulatory Visit: Payer: Self-pay | Admitting: Adult Health

## 2021-05-03 ENCOUNTER — Telehealth: Payer: Self-pay

## 2021-05-03 DIAGNOSIS — R7303 Prediabetes: Secondary | ICD-10-CM

## 2021-05-03 DIAGNOSIS — Z6841 Body Mass Index (BMI) 40.0 and over, adult: Secondary | ICD-10-CM

## 2021-05-03 DIAGNOSIS — I10 Essential (primary) hypertension: Secondary | ICD-10-CM

## 2021-05-03 NOTE — Progress Notes (Signed)
Orders Placed This Encounter  Procedures   AMB Referral to [facility]    Orders Placed This Encounter  Procedures   AMB Referral to [facility]    Referral Priority:   Routine    Referral Type:   Consultation    Referral Reason:   Care Coordination    Number of Visits Requested:   1

## 2021-05-03 NOTE — Progress Notes (Signed)
She continues within prediabetic range.  Glucose is elevated.  At 104. Cysts cholesterol is within normal limits.   vitamin D3 slightly low at 2000 international units  recommended once daily. CBC within normal limits.  TSH within normal limits.   Referral with pharmacist for evaluation for prediabetes and obesity to see if any options would be covered by her insurance such as [facility]

## 2021-05-03 NOTE — Chronic Care Management (AMB) (Signed)
  Care Management   Outreach Note  05/03/2021 Name: Holly Maynard MRN: [id number] DOB: 1966/04/27  Referred by: Doreen Beam, FNP Reason for referral : Care Coordination (Outreach to schedule referral with Pharm D )   An unsuccessful telephone outreach was attempted today. The patient was referred to the case management team for assistance with care management and care coordination.   Follow Up Plan:  The care management team will reach out to the patient again over the next 5 days.  If patient returns call to provider office, please advise to call [facility] at [address], [address], [address], [place] [address] Direct Dial: [phone] [email or web address]: [email or web address]

## 2021-05-13 NOTE — Chronic Care Management (AMB) (Signed)
°  Care Management   Outreach Note  05/13/2021 Name: [name] MRN: [id number] DOB: 04/25/1966  Referred by: Doreen Beam, FNP Reason for referral : Care Coordination (Outreach to schedule referral with Pharm D )   A second unsuccessful telephone outreach was attempted today. The patient was referred to the case management team for assistance with care management and care coordination.   Follow Up Plan:  A HIPAA compliant phone message was left for the patient providing contact information and requesting a return call.  The care management team will reach out to the patient again over the next 7 days.  If patient returns call to provider office, please advise to call [facility]  at [address], [address], [place], [place] [address] Direct Dial: [phone] [email or web address]: [email or web address]

## 2021-05-15 ENCOUNTER — Encounter: Payer: Self-pay | Admitting: Adult Health

## 2021-05-17 NOTE — Chronic Care Management (AMB) (Signed)
°  Care Management   Note  05/17/2021 Name: Holly Maynard MRN: [id number] DOB: Dec 30, 1965  Holly Maynard is a 55 y.o. year old female who is a primary care patient of Flinchum, Kelby Aline, FNP. I reached out to Holly Maynard by phone today in response to a referral sent by Ms. Roselynne Debruyne's primary care provider.   Ms. Lovins was given information about care management services today including:  Care management services include personalized support from designated clinical staff supervised by her physician, including individualized plan of care and coordination with other care providers 24/7 contact phone numbers for assistance for urgent and routine care needs. The patient may stop care management services at any time by phone call to the office staff.  Patient agreed to services and verbal consent obtained.   Follow up plan: Telephone appointment with care management team member scheduled for:05/24/2021  Noreene Larsson, [address], [address], [place] [address] Direct Dial: [phone] [email or web address]: [email or web address]

## 2021-05-23 ENCOUNTER — Telehealth: Payer: Self-pay | Admitting: *Deleted

## 2021-05-23 NOTE — Chronic Care Management (AMB) (Signed)
°  Care Management   Note  05/23/2021 Name: Holly Maynard MRN: [id number] DOB: 1966-05-11  Holly Maynard is a 55 y.o. year old female who is a primary care patient of Flinchum, Kelby Aline, FNP and is actively engaged with the care management team. I reached out to Debera Lat by phone today to assist with re-scheduling an initial visit with the Pharmacist  Follow up plan: Unsuccessful telephone outreach attempt made. The care management team will reach out to the patient again over the next 7 days.  If patient returns call to provider office, please advise to call [place] at [phone].  [facility]  Direct Dial: [phone]

## 2021-05-24 ENCOUNTER — Telehealth: Payer: Managed Care, Other (non HMO)

## 2021-05-24 NOTE — Chronic Care Management (AMB) (Signed)
°  Care Management   Note  05/24/2021 Name: Neka Bise MRN: [id number] DOB: [birth year]  Cortez Steelman is a 55 y.o. year old female who is a primary care patient of Flinchum, Kelby Aline, FNP and is actively engaged with the care management team.  Unique Sillas  was re-scheduled for an initial visit with the Pharmacist  Follow up plan: Telephone appointment with care management team member scheduled for: 06/21/21  [place] [facility]  Direct Dial: [phone]

## 2021-06-21 ENCOUNTER — Telehealth (INDEPENDENT_AMBULATORY_CARE_PROVIDER_SITE_OTHER): Payer: BC Managed Care – PPO | Admitting: Pharmacist

## 2021-06-21 ENCOUNTER — Other Ambulatory Visit: Payer: Self-pay | Admitting: Adult Health

## 2021-06-21 DIAGNOSIS — Z6841 Body Mass Index (BMI) 40.0 and over, adult: Secondary | ICD-10-CM

## 2021-06-21 DIAGNOSIS — R7303 Prediabetes: Secondary | ICD-10-CM | POA: Diagnosis not present

## 2021-06-21 NOTE — Progress Notes (Signed)
Chief Complaint  Patient presents with   Weight Management    Holly Maynard is a 56 y.o. year old female who was referred for medication management by their primary care provider, Flinchum, Kelby Aline, FNP. They presented for a virtual visit in the context of the COVID-19 pandemic.   I connected with Holly Maynard on 06/21/21 at 10:30 by video and verified that I am speaking with the correct person using two identifiers.   I discussed the limitations, risks, security and privacy concerns of performing an evaluation and management service by video and the availability of in person appointments. I also discussed with the patient that there may be a patient responsible charge related to this service. The patient expressed understanding and agreed to proceed.   Patient location:  work My Location:  office Persons on the video call:  patient and myself   Subjective: Obesity; Complicated by Pre-diabetes, Hypertension :  Current medications: none  Weight Management treatments previously prescribed: none  Reports active history until plantar fascitis that occurred while running a marathon. That in combination with family health complications, COVID resulted in weight gain.   Current meal patterns: Vegetarian. Very focused on incorporating appropriate proteins, vitamins, minerals. History of working with nutritionist. Denies significant issues with snacking  Current physical activity: limited by shoulder pain, knee pain due to weight. History of marathons. Working on being active at work Therapist, nutritional), speed walking. Stays as physically active as she can  Current medication access support: no concerns at this time  Hypertension:  Current medications: HCTZ 12.5 mg daily  Current meal patterns: as above  Current physical activity: as above   Hyperlipidemia/ASCVD Risk Reduction  Current lipid lowering medications: none prescription, taking fish oil OTC Medications tried in the past:  none     Objective:  Vitals with BMI 05/02/2021 04/25/2021 04/01/2019  Height 5\' 9"  5\' 9"  -  Weight 296 lbs 6 oz 301 lbs 4 oz -  BMI 69.62 95.28 -  Systolic 413 244 010  Diastolic 90 98 90  Pulse 68 85 -   Clinical ASCVD: No  The 10-year ASCVD risk score (Arnett DK, et al., 2019) is: 3%   Values used to calculate the score:     Age: 66 years     Sex: Female     Is Non-Hispanic African American: No     Diabetic: No     Tobacco smoker: No     Systolic Blood Pressure: 272 mmHg     Is BP treated: Yes     HDL Cholesterol: 42.8 mg/dL     Total Cholesterol: 154 mg/dL   Lab Results  Component Value Date   HGBA1C 6.1 05/02/2021    Lab Results  Component Value Date   CREATININE 0.58 05/02/2021   BUN 12 05/02/2021   NA 140 05/02/2021   K 4.7 05/02/2021   CL 104 05/02/2021   CO2 30 05/02/2021    Lab Results  Component Value Date   CHOL 154 05/02/2021   HDL 42.80 05/02/2021   LDLCALC 82 05/02/2021   TRIG 147.0 05/02/2021   CHOLHDL 4 05/02/2021    Medications Reviewed Today     Reviewed by De Hollingshead, RPH-CPP (Pharmacist) on 06/21/21 at 1029  Med List Status: <None>   Medication Order Taking? Sig Documenting Provider Last Dose Status Informant  Cholecalciferol (VITAMIN D) 50 MCG (2000 UT) CAPS [id number] Yes Take 2,000 Units by mouth daily. [provider] Taking Active  hydrochlorothiazide (MICROZIDE) 12.5 MG capsule [id number] Yes Take 1 capsule (12.5 mg total) by mouth daily. Flinchum, Kelby Aline, FNP Taking Active   ibuprofen (ADVIL) 200 MG tablet [id number] Yes Take 200 mg by mouth every 6 (six) hours as needed. [provider] Taking Active   Omega-3 Fatty Acids (FISH OIL) 1000 MG CAPS [id number] Yes Take 1 capsule by mouth daily. [provider] Taking Active             Assessment/Plan:   Obesity with Pre-Diabetes  - Currently unable to achieve goal weight loss of 5-10% through diet and lifestyle modifications alone -  Extensive dietary counseling including education on focus on lean proteins, fruits and vegetables, whole grains and increased fiber consumption, adequate hydration - Extensive exercise counseling including eventual goal of 150 minutes of moderate intensity exercise weekly as above - Provided motivational interviewing.  - Recommend to start GLP1 therapy as per PCP referral. Counseled on GLP1 agonists, including mechanism of action, side effects, and benefits. No documented personal or family history of medullary thyroid cancer (documented hx breast, skin, lung in mother); personal history of pancreatitis or gallbladder disease. Counseled on potential side effects of nausea, stomach upset, queasiness, constipation, and that these generally improve over time. Advised to contact our office with more severe symptoms, including nausea, diarrhea, stomach pain. Patient verbalized understanding. - Discussed logistics of access. Will start process of authorization for [facility].  - Discussed long term goals of 5-10% weight loss, reduction in risk of progression of cardiometabolic comorbidities, improvement in quality of life. Discussed that if goal weight loss not achieved, will discontinue medication - If Wegovy not covered or not beneficial, discussed potential role of metformin for pre-diabetes/insulin resistance.   Hypertension: - Currently controlled - Recommend to continue current regimen at this time. Discussed impact of weight loss on blood pressure and ASCVD risk reduction     Hyperlipidemia/ASCVD Risk Reduction: - No indication for statin therapy at this time.  - Discussed beneficial evidence for EPA compounds rather than DHA. Encouraged to consider.    Follow Up Plan: pending medication access  [name], PharmD, [place], [place] Clinical Pharmacist [employer] at [facility] [phone]

## 2021-06-21 NOTE — Progress Notes (Signed)
1. Prediabetes   2. Body mass index (BMI) of 40.1-44.9 in adult (HCC)   3. Class 3 severe obesity due to excess calories with body mass index (BMI) of 40.0 to 44.9 in adult, unspecified whether serious comorbidity present ([place])

## 2021-06-21 NOTE — Patient Instructions (Signed)
Holly Maynard,   It was great talking with you today!  We will pursue coverage of Wegovy (semaglutide) for treatment of obesity. I will let you know when I hear from your insurance.   Take care!  [name], PharmD

## 2021-06-24 ENCOUNTER — Telehealth: Payer: Self-pay | Admitting: Pharmacist

## 2021-06-24 NOTE — Telephone Encounter (Signed)
PA denied for [facility]. Will attempt an appeal. Appeal letter and documentation faxed to [facility] today. Patient notified that she would need to complete appeal form.

## 2021-06-27 ENCOUNTER — Encounter: Payer: Self-pay | Admitting: Adult Health

## 2021-06-27 ENCOUNTER — Ambulatory Visit: Payer: BC Managed Care – PPO | Admitting: Adult Health

## 2021-06-27 ENCOUNTER — Other Ambulatory Visit: Payer: Self-pay

## 2021-06-27 VITALS — BP 142/94 | HR 82 | Temp 97.9°F | Ht 69.0 in | Wt 298.0 lb

## 2021-06-27 DIAGNOSIS — X32XXXA Exposure to sunlight, initial encounter: Secondary | ICD-10-CM

## 2021-06-27 DIAGNOSIS — M7502 Adhesive capsulitis of left shoulder: Secondary | ICD-10-CM | POA: Diagnosis not present

## 2021-06-27 DIAGNOSIS — I10 Essential (primary) hypertension: Secondary | ICD-10-CM

## 2021-06-27 DIAGNOSIS — D229 Melanocytic nevi, unspecified: Secondary | ICD-10-CM

## 2021-06-27 DIAGNOSIS — R7303 Prediabetes: Secondary | ICD-10-CM

## 2021-06-27 DIAGNOSIS — Z1231 Encounter for screening mammogram for malignant neoplasm of breast: Secondary | ICD-10-CM

## 2021-06-27 DIAGNOSIS — E559 Vitamin D deficiency, unspecified: Secondary | ICD-10-CM

## 2021-06-27 DIAGNOSIS — Z1211 Encounter for screening for malignant neoplasm of colon: Secondary | ICD-10-CM

## 2021-06-27 DIAGNOSIS — Z6841 Body Mass Index (BMI) 40.0 and over, adult: Secondary | ICD-10-CM

## 2021-06-27 MED ORDER — HYDROCHLOROTHIAZIDE 25 MG PO TABS: 25.0000 mg | ORAL_TABLET | Freq: Every day | ORAL | 3 refills | Status: DC

## 2021-06-27 NOTE — Progress Notes (Signed)
Established Patient Office Visit  Subjective:  Patient ID: Holly Maynard, female    DOB: 24-Apr-1966  Age: 56 y.o. MRN: [id number]  CC:  Chief Complaint  Patient presents with   Follow-up    HPI Kathyjo Briere presents for follow up.   Hypertension- on hctz 12.5 mg daily. He notices her ankle edema has decreased, she has no shortness of breath, chest pain or abdominal pain.   She met with Elmer Picker D and had extensive dietary counseling on diet. Wegovy for weight loss. She is working on pulling back carbohydrates. She has not done denial yet.   Left chronic shoulder pain, referred to orthopedics. She received a cortisone and it was of great relief. Range of motion is improved. Adhesive capsulitis and tendonitis. Has a follow up this week.    Patient  denies any fever, body aches,chills, rash, chest pain, shortness of breath, nausea, vomiting, or diarrhea.  Denies dizziness, lightheadedness, pre syncopal or syncopal episodes.  Denies any abdominal or back pain.     History reviewed. No pertinent past medical history.  History reviewed. No pertinent surgical history.  Family History  Problem Relation Age of Onset   Hypertension Mother    Cancer Mother        Dwain Sarna and Lymph Node   Cancer Father     Social History   Socioeconomic History   Marital status: Single    Spouse name: Not on file   Number of children: Not on file   Years of education: Not on file   Highest education level: Not on file  Occupational History   Not on file  Tobacco Use   Smoking status: Never   Smokeless tobacco: Never  Vaping Use   Vaping Use: Never used  Substance and Sexual Activity   Alcohol use: Yes    Comment: ocassionally   Drug use: Never   Sexual activity: Not on file  Other Topics Concern   Not on file  Social History Narrative   Not on file   Social Determinants of Health   Financial Resource Strain: Not on file  Food Insecurity: Not on file  Transportation  Needs: Not on file  Physical Activity: Not on file  Stress: Not on file  Social Connections: Not on file  Intimate Partner Violence: Not on file    Outpatient Medications Prior to Visit  Medication Sig Dispense Refill   Cholecalciferol (VITAMIN D) 50 MCG (2000 UT) CAPS Take 2,000 Units by mouth daily.     ibuprofen (ADVIL) 200 MG tablet Take 200 mg by mouth every 6 (six) hours as needed.     Omega-3 Fatty Acids (FISH OIL) 1000 MG CAPS Take 1 capsule by mouth daily.     hydrochlorothiazide (MICROZIDE) 12.5 MG capsule Take 1 capsule (12.5 mg total) by mouth daily. 30 capsule 1   No facility-administered medications prior to visit.    Allergies  Allergen Reactions   Benzonatate Anaphylaxis    Felt like she couldn't breathe    ROS Review of Systems  Constitutional:  Negative for fatigue.  HENT: Negative.    Respiratory: Negative.    Cardiovascular: Negative.   Gastrointestinal: Negative.   Genitourinary: Negative.   Musculoskeletal:  Positive for arthralgias (left shoulder improving with steroid injection.has near follow up.). Negative for back pain, gait problem, joint swelling, myalgias, neck pain and neck stiffness.  Skin: Negative.   Neurological: Negative.   Psychiatric/Behavioral: Negative.       Objective:    Physical Exam  General: Appearance:    Obese female in no acute distress  Eyes:    PERRL, conjunctiva/corneas clear, EOM's intact       Lungs:     Clear to auscultation bilaterally, respirations unlabored  Heart:    Normal heart rate. Normal rhythm. No murmurs, rubs, or gallops.    MS:   All extremities are intact.    Neurologic:   Awake, alert, oriented x 3. No apparent focal neurological           defect.     BP (!) 142/94 (BP Location: Left Arm, Patient Position: Sitting, Cuff Size: Large)    Pulse 82    Temp 97.9 F (36.6 C) (Oral)    Ht 5' 9"  (1.753 m)    Wt 298 lb (135.2 kg)    LMP  (LMP Unknown)    SpO2 97%    BMI 44.01 kg/m  Wt Readings from Last 3  Encounters:  06/27/21 298 lb (135.2 kg)  05/02/21 296 lb 6.4 oz (134.4 kg)  04/25/21 (!) 301 lb 4 oz (136.6 kg)     Health Maintenance Due  Topic Date Due   COLONOSCOPY (Pts 45-49yr Insurance coverage will need to be confirmed)  Never done   MAMMOGRAM  Never done    There are no preventive care reminders to display for this patient.  Lab Results  Component Value Date   TSH 1.20 05/02/2021   Lab Results  Component Value Date   WBC 8.0 05/02/2021   HGB 14.5 05/02/2021   HCT 44.1 05/02/2021   MCV 86.9 05/02/2021   PLT 364.0 05/02/2021   Lab Results  Component Value Date   NA 140 05/02/2021   K 4.7 05/02/2021   CO2 30 05/02/2021   GLUCOSE 104 (H) 05/02/2021   BUN 12 05/02/2021   CREATININE 0.58 05/02/2021   BILITOT 0.4 05/02/2021   ALKPHOS 83 05/02/2021   AST 15 05/02/2021   ALT 19 05/02/2021   PROT 6.6 05/02/2021   ALBUMIN 4.2 05/02/2021   CALCIUM 10.0 05/02/2021   GFR 100.97 05/02/2021   Lab Results  Component Value Date   CHOL 154 05/02/2021   Lab Results  Component Value Date   HDL 42.80 05/02/2021   Lab Results  Component Value Date   LDLCALC 82 05/02/2021   Lab Results  Component Value Date   TRIG 147.0 05/02/2021   Lab Results  Component Value Date   CHOLHDL 4 05/02/2021   Lab Results  Component Value Date   HGBA1C 6.1 05/02/2021      Assessment & Plan:   Problem List Items Addressed This Visit       Cardiovascular and Mediastinum   Primary hypertension    06/27/21 hypertension not controlled HCTZ increased from 12.5 mg to 25 mg.  Diet and exercise, she feels she can be more active, with decreased shoulder pain since her shoulder injection.       Relevant Medications   hydrochlorothiazide (HYDRODIURIL) 25 MG tablet   Hypertension   Relevant Medications   hydrochlorothiazide (HYDRODIURIL) 25 MG tablet   Other Relevant Orders   Comprehensive metabolic panel     Musculoskeletal and Integument   Adhesive capsulitis of left  shoulder    Steroid injection at orthopedics much improved range of motion,she is doing excercies and stretches. Has orthopedics referral.       Numerous moles   Relevant Orders   Ambulatory referral to Dermatology     Other   Prediabetes   Relevant Orders  Comprehensive metabolic panel   Class 3 severe obesity due to excess calories with body mass index (BMI) of 40.0 to 44.9 in adult [facility]) - Primary    The patient is advised to begin progressive daily aerobic exercise program, follow a low fat, low cholesterol diet, attempt to lose weight, reduce salt in diet and cooking and reduce exposure to stress.      Vitamin D deficiency   Relevant Orders   VITAMIN D 25 Hydroxy (Vit-D Deficiency, Fractures)   Screening mammogram for breast cancer   Relevant Orders   MM DIGITAL SCREENING BILATERAL   Moderate sun exposure   Relevant Orders   Ambulatory referral to Dermatology    Meds ordered this encounter  Medications   hydrochlorothiazide (HYDRODIURIL) 25 MG tablet    Sig: Take 1 tablet (25 mg total) by mouth daily.    Dispense:  90 tablet    Refill:  3   Medications Discontinued During This Encounter  Medication Reason   hydrochlorothiazide (MICROZIDE) 12.5 MG capsule     Monitor blood pressure, and also keep log, discussed we will likely need to add on another antihypertensive. Parameters given for blood pressure per guidelines.    The patient is instructed to continue all medications as prescribed. Schedule followup with check out clerk upon leaving the clinic  The patient is advised to begin progressive daily aerobic exercise program, follow a low fat, low cholesterol diet, attempt to lose weight, improve dietary compliance, use calcium 1 gram daily with Vit D, continue current medications, continue current healthy lifestyle patterns, and return for routine annual checkups with Donald Jacque, Kelby Aline, FNP,  will need to schedule physical in near future.     Red Flags discussed.  The patient was given clear instructions to go to ER or return to medical center if any red flags develop, symptoms do not improve, worsen or new problems develop. They verbalized understanding.   Follow-up: Return in about 3 months (around 09/24/2021), or if symptoms worsen or fail to improve, for at any time for any worsening symptoms, Go to Emergency room/ urgent care if worse.    Marcille Buffy, FNP

## 2021-06-27 NOTE — Patient Instructions (Addendum)
Call to schedule your screening mammogram. Your orders have been placed for your exam.  Let our office know if you have questions, concerns, or any difficulty scheduling.  If normal results then yearly screening mammograms are recommended unless you notice  Changes in your breast then you should schedule a follow up office visit. If abnormal results  Further imaging will be warranted and sooner follow up as determined by the radiologist at the [facility].   [facility] at [place], [place] [address]  Main: [phone]     [place] Diet A [place] refers to food and lifestyle choices that are based on the traditions of countries located on the [place]. It focuses on eating more fruits, vegetables, whole grains, beans, nuts, seeds, and heart-healthy fats, and eating less dairy, meat, eggs, and processed foods with added sugar, salt, and fat. This way of eating has been shown to help prevent certain conditions and improve outcomes for people who have chronic diseases, like kidney disease and heart disease. What are tips for following this plan? Reading food labels Check the serving size of packaged foods. For foods such as rice and pasta, the serving size refers to the amount of cooked product, not dry. Check the total fat in packaged foods. Avoid foods that have saturated fat or trans fats. Check the ingredient list for added sugars, such as corn syrup. Shopping  Buy a variety of foods that offer a balanced diet, including: Fresh fruits and vegetables (produce). Grains, beans, nuts, and seeds. Some of these may be available in unpackaged forms or large amounts (in bulk). Fresh seafood. Poultry and eggs. Low-fat dairy products. Buy whole ingredients instead of prepackaged foods. Buy fresh fruits and vegetables in-season from local farmers markets. Buy plain frozen fruits and vegetables. If you do not have access to  quality fresh seafood, buy precooked frozen shrimp or canned fish, such as tuna, salmon, or sardines. Stock your pantry so you always have certain foods on hand, such as olive oil, canned tuna, canned tomatoes, rice, pasta, and beans. Cooking Cook foods with extra-virgin olive oil instead of using butter or other vegetable oils. Have meat as a side dish, and have vegetables or grains as your main dish. This means having meat in small portions or adding small amounts of meat to foods like pasta or stew. Use beans or vegetables instead of meat in common dishes like chili or lasagna. Experiment with different cooking methods. Try roasting, broiling, steaming, and sauting vegetables. Add frozen vegetables to soups, stews, pasta, or rice. Add nuts or seeds for added healthy fats and plant protein at each meal. You can add these to yogurt, salads, or vegetable dishes. Marinate fish or vegetables using olive oil, lemon juice, garlic, and fresh herbs. Meal planning Plan to eat one vegetarian meal one day each week. Try to work up to two vegetarian meals, if possible. Eat seafood two or more times a week. Have healthy snacks readily available, such as: Vegetable sticks with hummus. Greek yogurt. Fruit and nut trail mix. Eat balanced meals throughout the week. This includes: Fruit: 2-3 servings a day. Vegetables: 4-5 servings a day. Low-fat dairy: 2 servings a day. Fish, poultry, or lean meat: 1 serving a day. Beans and legumes: 2 or more servings a week. Nuts and seeds: 1-2 servings a day. Whole grains: 6-8 servings a day. Extra-virgin olive oil: 3-4 servings a day. Limit red meat and sweets to only a few servings  a month. Lifestyle  Cook and eat meals together with your family, when possible. Drink enough fluid to keep your urine pale yellow. Be physically active every day. This includes: Aerobic exercise like running or swimming. Leisure activities like gardening, walking, or  housework. Get 7-8 hours of sleep each night. If recommended by your health care provider, drink red wine in moderation. This means 1 glass a day for nonpregnant women and 2 glasses a day for men. A glass of wine equals 5 oz (150 mL). What foods should I eat? Fruits Apples. Apricots. Avocado. Berries. Bananas. Cherries. Dates. Figs. Grapes. Lemons. Melon. Oranges. Peaches. Plums. Pomegranate. Vegetables Artichokes. Beets. Broccoli. Cabbage. Carrots. Eggplant. Green beans. Chard. Kale. Spinach. Onions. Leeks. Peas. Squash. Tomatoes. Peppers. Radishes. Grains Whole-grain pasta. Brown rice. Bulgur wheat. Polenta. Couscous. Whole-wheat bread. [place]. Meats and other proteins Beans. Almonds. Sunflower seeds. Pine nuts. Peanuts. [place]. Salmon. Scallops. Shrimp. [place]. Tilapia. Clams. Oysters. Eggs. Poultry without skin. Dairy Low-fat milk. Cheese. Greek yogurt. Fats and oils Extra-virgin olive oil. Avocado oil. Grapeseed oil. Beverages Water. Red wine. Herbal tea. Sweets and desserts Greek yogurt with honey. Baked apples. Poached pears. Trail mix. Seasonings and condiments Basil. Cilantro. Coriander. Cumin. Mint. Parsley. Sage. Rosemary. Tarragon. Garlic. Oregano. Thyme. Pepper. Balsamic vinegar. Tahini. Hummus. Tomato sauce. Olives. Mushrooms. The items listed above may not be a complete list of foods and beverages you can eat. Contact a dietitian for more information. What foods should I limit? This is a list of foods that should be eaten rarely or only on special occasions. Fruits Fruit canned in syrup. Vegetables Deep-fried potatoes (french fries). Grains Prepackaged pasta or rice dishes. Prepackaged cereal with added sugar. Prepackaged snacks with added sugar. Meats and other proteins Beef. Pork. Lamb. Poultry with skin. Hot dogs. Berniece Salines. Dairy Ice cream. Sour cream. Whole milk. Fats and oils Butter. Canola oil. Vegetable oil. Beef fat (tallow). Lard. Beverages Juice.  Sugar-sweetened soft drinks. Beer. Liquor and spirits. Sweets and desserts Cookies. Cakes. Pies. Candy. Seasonings and condiments Mayonnaise. Pre-made sauces and marinades. The items listed above may not be a complete list of foods and beverages you should limit. Contact a dietitian for more information. Summary The Mediterranean diet includes both food and lifestyle choices. Eat a variety of fresh fruits and vegetables, beans, nuts, seeds, and whole grains. Limit the amount of red meat and sweets that you eat. If recommended by your health care provider, drink red wine in moderation. This means 1 glass a day for nonpregnant women and 2 glasses a day for men. A glass of wine equals 5 oz (150 mL). This information is not intended to replace advice given to you by your health care provider. Make sure you discuss any questions you have with your health care provider. Document Revised: 06/17/2019 Document Reviewed: 04/14/2019 Elsevier Patient Education  2022 [place]. Hypertension, Adult Hypertension is another name for high blood pressure. High blood pressure forces your heart to work harder to pump blood. This can cause problems over time. There are two numbers in a blood pressure reading. There is a top number (systolic) over a bottom number (diastolic). It is best to have a blood pressure that is below 120/80. Healthy choices can help lower your blood pressure, or you may need medicine to help lower it. What are the causes? The cause of this condition is not known. Some conditions may be related to high blood pressure. What increases the risk? Smoking. Having type 2 diabetes mellitus, high cholesterol, or both. Not  getting enough exercise or physical activity. Being overweight. Having too much fat, sugar, calories, or salt (sodium) in your diet. Drinking too much alcohol. Having long-term (chronic) kidney disease. Having a family history of high blood pressure. Age. Risk increases  with age. Race. You may be at higher risk if you are African American. Gender. Men are at higher risk than women before age 76. After age 81, women are at higher risk than men. Having obstructive sleep apnea. Stress. What are the signs or symptoms? High blood pressure may not cause symptoms. Very high blood pressure (hypertensive crisis) may cause: Headache. Feelings of worry or nervousness (anxiety). Shortness of breath. Nosebleed. A feeling of being sick to your stomach (nausea). Throwing up (vomiting). Changes in how you see. Very bad chest pain. Seizures. How is this treated? This condition is treated by making healthy lifestyle changes, such as: Eating healthy foods. Exercising more. Drinking less alcohol. Your health care provider may prescribe medicine if lifestyle changes are not enough to get your blood pressure under control, and if: Your top number is above 130. Your bottom number is above 80. Your personal target blood pressure may vary. Follow these instructions at home: Eating and drinking  If told, follow the DASH eating plan. To follow this plan: Fill one half of your plate at each meal with fruits and vegetables. Fill one fourth of your plate at each meal with whole grains. Whole grains include whole-wheat pasta, brown rice, and whole-grain bread. Eat or drink low-fat dairy products, such as skim milk or low-fat yogurt. Fill one fourth of your plate at each meal with low-fat (lean) proteins. Low-fat proteins include fish, chicken without skin, eggs, beans, and tofu. Avoid fatty meat, cured and processed meat, or chicken with skin. Avoid pre-made or processed food. Eat less than 1,500 mg of salt each day. Do not drink alcohol if: Your doctor tells you not to drink. You are pregnant, may be pregnant, or are planning to become pregnant. If you drink alcohol: Limit how much you use to: 0-1 drink a day for women. 0-2 drinks a day for men. Be aware of how much  alcohol is in your drink. In the [place] one drink equals one 12 oz bottle of beer (355 mL), one 5 oz glass of wine (148 mL), or one 1 oz glass of hard liquor (44 mL). Lifestyle  Work with your doctor to stay at a healthy weight or to lose weight. Ask your doctor what the best weight is for you. Get at least 30 minutes of exercise most days of the week. This may include walking, swimming, or biking. Get at least 30 minutes of exercise that strengthens your muscles (resistance exercise) at least 3 days a week. This may include lifting weights or doing Pilates. Do not use any products that contain nicotine or tobacco, such as cigarettes, e-cigarettes, and chewing tobacco. If you need help quitting, ask your doctor. Check your blood pressure at home as told by your doctor. Keep all follow-up visits as told by your doctor. This is important. Medicines Take over-the-counter and prescription medicines only as told by your doctor. Follow directions carefully. Do not skip doses of blood pressure medicine. The medicine does not work as well if you skip doses. Skipping doses also puts you at risk for problems. Ask your doctor about side effects or reactions to medicines that you should watch for. Contact a doctor if you: Think you are having a reaction to the medicine you are taking.  Have headaches that keep coming back (recurring). Feel dizzy. Have swelling in your ankles. Have trouble with your vision. Get help right away if you: Get a very bad headache. Start to feel mixed up (confused). Feel weak or numb. Feel faint. Have very bad pain in your: Chest. Belly (abdomen). Throw up more than once. Have trouble breathing. Summary Hypertension is another name for high blood pressure. High blood pressure forces your heart to work harder to pump blood. For most people, a normal blood pressure is less than 120/80. Making healthy choices can help lower blood pressure. If your blood pressure does not  get lower with healthy choices, you may need to take medicine. This information is not intended to replace advice given to you by your health care provider. Make sure you discuss any questions you have with your health care provider. Document Revised: 01/20/2018 Document Reviewed: 01/20/2018 Elsevier Patient Education  [place].

## 2021-06-27 NOTE — Assessment & Plan Note (Signed)
The patient is advised to begin progressive daily aerobic exercise program, follow a low fat, low cholesterol diet, attempt to lose weight, reduce salt in diet and cooking and reduce exposure to stress.

## 2021-06-27 NOTE — Assessment & Plan Note (Signed)
Steroid injection at orthopedics much improved range of motion,she is doing excercies and stretches. Has orthopedics referral.

## 2021-06-27 NOTE — Assessment & Plan Note (Signed)
06/27/21 hypertension not controlled HCTZ increased from 12.5 mg to 25 mg.  Diet and exercise, she feels she can be more active, with decreased shoulder pain since her shoulder injection.

## 2021-06-30 DIAGNOSIS — X32XXXA Exposure to sunlight, initial encounter: Secondary | ICD-10-CM | POA: Insufficient documentation

## 2021-06-30 DIAGNOSIS — Z1231 Encounter for screening mammogram for malignant neoplasm of breast: Secondary | ICD-10-CM | POA: Insufficient documentation

## 2021-06-30 DIAGNOSIS — D229 Melanocytic nevi, unspecified: Secondary | ICD-10-CM | POA: Insufficient documentation

## 2021-06-30 DIAGNOSIS — E559 Vitamin D deficiency, unspecified: Secondary | ICD-10-CM | POA: Insufficient documentation

## 2021-07-04 ENCOUNTER — Ambulatory Visit: Payer: Managed Care, Other (non HMO) | Admitting: Adult Health

## 2021-07-09 ENCOUNTER — Telehealth: Payer: Self-pay | Admitting: Pharmacist

## 2021-07-09 DIAGNOSIS — R7303 Prediabetes: Secondary | ICD-10-CM

## 2021-07-09 NOTE — Telephone Encounter (Signed)
Called [employer]. Appeal was denied, noted that Mancel Parsons is a plan exclusion.   Called patient to notify, mail box was full. Will send MyChart message. As previously discussed, we discussed use of metformin for pre-diabetes/insulin resistance.

## 2021-07-10 MED ORDER — METFORMIN HCL ER 500 MG PO TB24: 500.0000 mg | ORAL_TABLET | Freq: Two times a day (BID) | ORAL | 1 refills | Status: DC

## 2021-07-10 NOTE — Telephone Encounter (Signed)
Script sent for metformin under Dr. Derrel Nip. Follow up scheduled.

## 2021-07-10 NOTE — Addendum Note (Signed)
Addended by: De Hollingshead on: 07/10/2021 08:09 AM   Modules accepted: Orders

## 2021-07-30 ENCOUNTER — Ambulatory Visit: Payer: Self-pay | Admitting: Pharmacist

## 2021-07-30 NOTE — Chronic Care Management (AMB) (Signed)
?   Care Management  ? ?Note ? ?07/30/2021 ?Name: Taurus Willis MRN: [id number] DOB: 1965/06/24 ? ? ? ?Closing pharmacy case at this time. Patient has clinic contact information for future questions or concerns.  ? ?[name], PharmD, [place], CPP ?Clinical Pharmacist ?Therapist, music at [employer] ?[phone] ? ?

## 2021-07-30 NOTE — Patient Instructions (Signed)
Hi Meah,  ? ?Unfortunately, I am being asked to quickly transition into another role within the health system, so I am unable to keep our next appointment. Please continue to follow up with your primary care provider as scheduled.  ? ?It has been a pleasure working with you! ? ?[name], PharmD ? ?

## 2021-09-05 ENCOUNTER — Telehealth: Payer: BC Managed Care – PPO

## 2021-12-11 ENCOUNTER — Ambulatory Visit (INDEPENDENT_AMBULATORY_CARE_PROVIDER_SITE_OTHER): Payer: BC Managed Care – PPO | Admitting: Dermatology

## 2021-12-11 ENCOUNTER — Encounter: Payer: Self-pay | Admitting: Dermatology

## 2021-12-11 DIAGNOSIS — D3617 Benign neoplasm of peripheral nerves and autonomic nervous system of trunk, unspecified: Secondary | ICD-10-CM | POA: Diagnosis not present

## 2021-12-11 DIAGNOSIS — L821 Other seborrheic keratosis: Secondary | ICD-10-CM

## 2021-12-11 DIAGNOSIS — Z808 Family history of malignant neoplasm of other organs or systems: Secondary | ICD-10-CM

## 2021-12-11 DIAGNOSIS — D229 Melanocytic nevi, unspecified: Secondary | ICD-10-CM

## 2021-12-11 DIAGNOSIS — D2362 Other benign neoplasm of skin of left upper limb, including shoulder: Secondary | ICD-10-CM

## 2021-12-11 DIAGNOSIS — Z1283 Encounter for screening for malignant neoplasm of skin: Secondary | ICD-10-CM

## 2021-12-11 DIAGNOSIS — D239 Other benign neoplasm of skin, unspecified: Secondary | ICD-10-CM

## 2021-12-11 DIAGNOSIS — D485 Neoplasm of uncertain behavior of skin: Secondary | ICD-10-CM

## 2021-12-11 DIAGNOSIS — D492 Neoplasm of unspecified behavior of bone, soft tissue, and skin: Secondary | ICD-10-CM

## 2021-12-11 NOTE — Patient Instructions (Addendum)
Wound Care Instructions  Cleanse wound gently with soap and water once a day then pat dry with clean gauze. Apply a thing coat of Petrolatum (petroleum jelly, "Vaseline") over the wound (unless you have an allergy to this). We recommend that you use a new, sterile tube of Vaseline. Do not pick or remove scabs. Do not remove the yellow or white "healing tissue" from the base of the wound.  Cover the wound with fresh, clean, nonstick gauze and secure with paper tape. You may use Band-Aids in place of gauze and tape if the would is small enough, but would recommend trimming much of the tape off as there is often too much. Sometimes Band-Aids can irritate the skin.  You should call the office for your biopsy report after 1 week if you have not already been contacted.  If you experience any problems, such as abnormal amounts of bleeding, swelling, significant bruising, significant pain, or evidence of infection, please call the office immediately.  FOR ADULT SURGERY PATIENTS: If you need something for pain relief you may take 1 extra strength Tylenol (acetaminophen) AND 2 Ibuprofen (200mg each) together every 4 hours as needed for pain. (do not take these if you are allergic to them or if you have a reason you should not take them.) Typically, you may only need pain medication for 1 to 3 days.    Due to recent changes in healthcare laws, you may see results of your pathology and/or laboratory studies on MyChart before the doctors have had a chance to review them. We understand that in some cases there may be results that are confusing or concerning to you. Please understand that not all results are received at the same time and often the doctors may need to interpret multiple results in order to provide you with the best plan of care or course of treatment. Therefore, we ask that you please give us 2 business days to thoroughly review all your results before contacting the office for clarification. Should we  see a critical lab result, you will be contacted sooner.   If You Need Anything After Your Visit  If you have any questions or concerns for your doctor, please call our main line at [phone] and press option 4 to reach your doctor's medical assistant. If no one answers, please leave a voicemail as directed and we will return your call as soon as possible. Messages left after 4 pm will be answered the following business day.   You may also send us a message via MyChart. We typically respond to MyChart messages within 1-2 business days.  For prescription refills, please ask your pharmacy to contact our office. Our fax number is [phone].  If you have an urgent issue when the clinic is closed that cannot wait until the next business day, you can page your doctor at the number below.    Please note that while we do our best to be available for urgent issues outside of office hours, we are not available 24/7.   If you have an urgent issue and are unable to reach us, you may choose to seek medical care at your doctor's office, retail clinic, urgent care center, or emergency room.  If you have a medical emergency, please immediately call 911 or go to the emergency department.  Pager Numbers  - Dr. Kowalski: [phone]  - Dr. Moye: [phone]  - Dr. Stewart: [phone]  In the event of inclement weather, please call our main line at [phone]   for an update on the status of any delays or closures.  Dermatology Medication Tips: Please keep the boxes that topical medications come in in order to help keep track of the instructions about where and how to use these. Pharmacies typically print the medication instructions only on the boxes and not directly on the medication tubes.   If your medication is too expensive, please contact our office at [phone] option 4 or send us a message through MyChart.   We are unable to tell what your co-pay for medications will be in advance  as this is different depending on your insurance coverage. However, we may be able to find a substitute medication at lower cost or fill out paperwork to get insurance to cover a needed medication.   If a prior authorization is required to get your medication covered by your insurance company, please allow us 1-2 business days to complete this process.  Drug prices often vary depending on where the prescription is filled and some pharmacies may offer cheaper prices.  The website www.goodrx.com contains coupons for medications through different pharmacies. The prices here do not account for what the cost may be with help from insurance (it may be cheaper with your insurance), but the website can give you the price if you did not use any insurance.  - You can print the associated coupon and take it with your prescription to the pharmacy.  - You may also stop by our office during regular business hours and pick up a GoodRx coupon card.  - If you need your prescription sent electronically to a different pharmacy, notify our office through [facility] MyChart or by phone at [phone] option 4.     Si Usted Necesita Algo Despus de Su Visita  Tambin puede enviarnos un mensaje a travs de MyChart. Por lo general respondemos a los mensajes de MyChart en el transcurso de 1 a 2 das hbiles.  Para renovar recetas, por favor pida a su farmacia que se ponga en contacto con nuestra oficina. Nuestro nmero de fax es el [phone].  Si tiene un asunto urgente cuando la clnica est cerrada y que no puede esperar hasta el siguiente da hbil, puede llamar/localizar a su doctor(a) al nmero que aparece a continuacin.   Por favor, tenga en cuenta que aunque hacemos todo lo posible para estar disponibles para asuntos urgentes fuera del horario de oficina, no estamos disponibles las 24 horas del da, los 7 das de la semana.   Si tiene un problema urgente y no puede comunicarse con nosotros, puede optar  por buscar atencin mdica  en el consultorio de su doctor(a), en una clnica privada, en un centro de atencin urgente o en una sala de emergencias.  Si tiene una emergencia mdica, por favor llame inmediatamente al 911 o vaya a la sala de emergencias.  Nmeros de bper  - Dr. Kowalski: [phone]  - Dra. Moye: [phone]  - Dra. Stewart: [phone]  En caso de inclemencias del tiempo, por favor llame a nuestra lnea principal al [phone] para una actualizacin sobre el estado de cualquier retraso o cierre.  Consejos para la medicacin en dermatologa: Por favor, guarde las cajas en las que vienen los medicamentos de uso tpico para ayudarle a seguir las instrucciones sobre dnde y cmo usarlos. Las farmacias generalmente imprimen las instrucciones del medicamento slo en las cajas y no directamente en los tubos del medicamento.   Si su medicamento es muy caro, por favor, pngase en contacto con nuestra   oficina llamando al [phone] y presione la opcin 4 o envenos un mensaje a travs de MyChart.   No podemos decirle cul ser su copago por los medicamentos por adelantado ya que esto es diferente dependiendo de la cobertura de su seguro. Sin embargo, es posible que podamos encontrar un medicamento sustituto a menor costo o llenar un formulario para que el seguro cubra el medicamento que se considera necesario.   Si se requiere una autorizacin previa para que su compaa de seguros cubra su medicamento, por favor permtanos de 1 a 2 das hbiles para completar este proceso.  Los precios de los medicamentos varan con frecuencia dependiendo del lugar de dnde se surte la receta y alguna farmacias pueden ofrecer precios ms baratos.  El sitio web www.goodrx.com tiene cupones para medicamentos de diferentes farmacias. Los precios aqu no tienen en cuenta lo que podra costar con la ayuda del seguro (puede ser ms barato con su seguro), pero el sitio web puede darle el precio si  no utiliz ningn seguro.  - Puede imprimir el cupn correspondiente y llevarlo con su receta a la farmacia.  - Tambin puede pasar por nuestra oficina durante el horario de atencin regular y recoger una tarjeta de cupones de GoodRx.  - Si necesita que su receta se enve electrnicamente a una farmacia diferente, informe a nuestra oficina a travs de MyChart de [place] o por telfono llamando al [phone] y presione la opcin 4.  

## 2021-12-11 NOTE — Progress Notes (Signed)
   New Patient Visit  Subjective  Holly Maynard is a 56 y.o. female who presents for the following: check spots (Back, while, no symptoms, no hx of skin ca, Mother with hx of Melanoma). New patient referral from [name], FNP The patient has spots, moles and lesions to be evaluated, some may be new or changing and the patient has concerns that these could be cancer.  The following portions of the chart were reviewed this encounter and updated as appropriate:   Tobacco  Allergies  Meds  Problems  Med Hx  Surg Hx  Fam Hx     Review of Systems:  No other skin or systemic complaints except as noted in HPI or Assessment and Plan.  Objective  Well appearing patient in no apparent distress; mood and affect are within normal limits.  A focused examination was performed including back. Relevant physical exam findings are noted in the Assessment and Plan.  L med scapula Mamillated pap 1.5cm     L deltoid Firm pink/brown papulenodule with dimple sign.    Assessment & Plan   Seborrheic Keratoses - Stuck-on, waxy, tan-brown papules and/or plaques  - Benign-appearing - Discussed benign etiology and prognosis. - Observe - Call for any changes  Melanocytic Nevi - Tan-brown and/or pink-flesh-colored symmetric macules and papules - Benign appearing on exam today - Observation - Call clinic for new or changing moles - Recommend daily use of broad spectrum spf 30+ sunscreen to sun-exposed areas.    Neoplasm of skin L med scapula Epidermal / dermal shaving  Lesion diameter (cm):  1.5 Informed consent: discussed and consent obtained   Timeout: patient name, date of birth, surgical site, and procedure verified   Procedure prep:  Patient was prepped and draped in usual sterile fashion Prep type:  Isopropyl alcohol Anesthesia: the lesion was anesthetized in a standard fashion   Anesthetic:  1% lidocaine w/ epinephrine 1-100,000 buffered w/ 8.4% NaHCO3 Instrument used:  flexible razor blade   Hemostasis achieved with: pressure, aluminum chloride and electrodesiccation   Outcome: patient tolerated procedure well   Post-procedure details: sterile dressing applied and wound care instructions given   Dressing type: bandage, petrolatum and bacitracin    Specimen 1 - Surgical pathology Differential Diagnosis: D48.5 Irritated Nevus r/o Atypia Check Margins: yes Mamillated pap 1.5cm  Dermatofibroma L deltoid A dermatofibroma is a benign growth possibly related to trauma, such as an insect bite or inflamed acne-type bump.  Discussed removal (shave vrs excision) with resulting scar and risk of recurrence.  Since not bothersome, will observe for now. Benign, observe  Return if symptoms worsen or fail to improve.  I, Holly Maynard, RMA, am acting as scribe for Sarina Ser, MD . Documentation: I have reviewed the above documentation for accuracy and completeness, and I agree with the above.  Sarina Ser, MD

## 2021-12-16 ENCOUNTER — Telehealth: Payer: Self-pay

## 2021-12-16 NOTE — Telephone Encounter (Signed)
-----   Message from Ralene Bathe, MD sent at 12/12/2021  5:48 PM EDT ----- Diagnosis Skin , left med scapula NEUROFIBROMA, BASE INVOLVED  Benign neurofibroma = "mole" No further treatment needed

## 2021-12-16 NOTE — Telephone Encounter (Signed)
Unable to leave a message voicemail box full.

## 2021-12-17 ENCOUNTER — Telehealth: Payer: Self-pay

## 2021-12-17 NOTE — Telephone Encounter (Signed)
-----   Message from Ralene Bathe, MD sent at 12/12/2021  5:48 PM EDT ----- Diagnosis Skin , left med scapula NEUROFIBROMA, BASE INVOLVED  Benign neurofibroma = "mole" No further treatment needed

## 2021-12-17 NOTE — Telephone Encounter (Signed)
Voicemail box full unable to leave a message.  

## 2021-12-21 ENCOUNTER — Encounter: Payer: Self-pay | Admitting: Dermatology

## 2021-12-26 ENCOUNTER — Telehealth: Payer: Self-pay

## 2021-12-26 NOTE — Telephone Encounter (Signed)
Left voicemail to return my call. Letter sent to patient.

## 2021-12-26 NOTE — Telephone Encounter (Signed)
-----   Message from Ralene Bathe, MD sent at 12/12/2021  5:48 PM EDT ----- Diagnosis Skin , left med scapula NEUROFIBROMA, BASE INVOLVED  Benign neurofibroma = "mole" No further treatment needed

## 2022-01-24 ENCOUNTER — Other Ambulatory Visit: Payer: Self-pay | Admitting: Internal Medicine

## 2022-01-24 DIAGNOSIS — R7303 Prediabetes: Secondary | ICD-10-CM

## 2022-01-28 ENCOUNTER — Other Ambulatory Visit: Payer: Self-pay

## 2022-01-28 DIAGNOSIS — R7303 Prediabetes: Secondary | ICD-10-CM

## 2022-01-28 MED ORDER — METFORMIN HCL ER 500 MG PO TB24: ORAL_TABLET | ORAL | 0 refills | Status: DC

## 2022-04-08 DIAGNOSIS — M7542 Impingement syndrome of left shoulder: Secondary | ICD-10-CM | POA: Diagnosis not present

## 2022-04-14 DIAGNOSIS — M25512 Pain in left shoulder: Secondary | ICD-10-CM | POA: Diagnosis not present

## 2022-04-21 ENCOUNTER — Ambulatory Visit (INDEPENDENT_AMBULATORY_CARE_PROVIDER_SITE_OTHER): Payer: BC Managed Care – PPO | Admitting: Family Medicine

## 2022-04-21 ENCOUNTER — Encounter: Payer: Self-pay | Admitting: Family Medicine

## 2022-04-21 VITALS — Ht 69.0 in | Wt 285.8 lb

## 2022-04-21 DIAGNOSIS — R7303 Prediabetes: Secondary | ICD-10-CM | POA: Diagnosis not present

## 2022-04-21 DIAGNOSIS — Z6841 Body Mass Index (BMI) 40.0 and over, adult: Secondary | ICD-10-CM | POA: Diagnosis not present

## 2022-04-21 NOTE — Patient Instructions (Addendum)
Behavioral Goals:  1. Eat at least 3 REAL meals and 0-1 snack per day.  Eat breakfast within one hour of getting up.  Aim for no more than 5 hours between eating.   A REAL meal includes at least some protein, some starch, and vegetables and/or fruit.   (OR: Would you serve this to a guest in your home, and call it a meal?)    - Ideally, for each lunch and dinner, you will have twice the volume of veg's as either protein or starch.      - Beans are the classic vegetarian protein source.      - Keep frozen veg's on hand.      - Make good use of soups.  Even if you use canned soup, first microwave veg's, then add the soup, and reheat.    2. Have at least 8 home-prepared lunches and dinners each week.    3. Walk 30 minutes 5 times a week AND do daily PT exercises as prescribed.    Document progress on your above goals using the form provided today.  (Bring your form to follo-up.)   Yogurt:  Instead of using non-caloric sweeteners, mix plain with flavored, sweetened yogurt.    Book recommendation: Breath by Cain Saupe  Follow-up appt on Tuesday, Dec 12 at 10 AM.

## 2022-04-21 NOTE — Progress Notes (Signed)
Medical Nutrition Therapy Appt start time: 1430 end time: 7680 (1 hour) Primary concerns today: Weight management.  PCP [facility], [place], [address]   Relevant history/background: Holly Maynard was seen by me for MNT many years ago for anorexia nervosa, which she said resolved in her 80's.  She presents today for weight management (06-27-21: wt 298 lb; BMI 40; ht 69").  Other diagnoses include prediabetes and HTN.  Follows a lacto-ovo vegetarian diet.  Holly Maynard gained weight during her last job with [employer] while also helping care for her mom who had cancer.  She then gained ~80 lb in the 5 months after her mom died.  She has been on sabbatical from her job since 02/04/22, so is now able to focus time and energy on self-care.    Assessment:  Holly Maynard is interested in overall health rather than just weight management.  Learning Readiness: Change in progress:  Has been moving more, and is more mindful with respect to food choices.     Usual eating pattern: 3 meals and 0-1 snack per day. Frequent foods and beverages: water, 24-32 oz diet Coke, juice; salads, fruit, rice, whwh bread, yogurt, beans, quinoa, popcorn. Morningstar breakfast patties.  Avoided foods: meat and flesh foods, salty foods, butter, margarine, beets, Br sprouts.   Usual physical activity: Walks 30 min 5 X wk (since October 2023).  Recently started PT for L shoulder, and will be doing dailyband exercises.   Sleep: Estimates she gets 7-7 1/2 hrs per night.  Impinged nerve on L shoulder has made sleep more difficult, but doing better since recent injxn.    24-hr recall: (Up at 8 AM) B (9:30 AM)-   1 thin wrap, 1 Morningstar veggie patty, 1 banana, water Snk ( AM)-   water L (1 PM)-  3-chs ravioli (360 kcal) frozen meal, diet Coke Snk (3 PM)-  1 handful cashews (240 kcal), water  D (6 PM)-  1 c blk beans&rice frozen meal (340 kcal), light sour cream (35 kcal, diet Coke  Snk ( PM)-  water Typical day? Yes.    Sometimes has salad, and often has fruit 2 X day.    Nutritional Diagnosis:  NI-5.8.3 Inappropriate intake of types of carbohydrates (specify):   As related to inadequate vegetable intake.  As evidenced by frequent intake of frozen meals and few veg's.  Handouts given during visit include: After-Visit Summary (AVS) Blank goals tracker form.   Demonstrated degree of understanding via:  Teach Back  Barriers to learning/adherence to lifestyle change: Longstanding dietary habits (although may be offset by patient's apparent motivation for change.)  Monitoring/Evaluation:  Dietary intake, exercise, and body weight in 2 week(s).

## 2022-05-05 DIAGNOSIS — M25512 Pain in left shoulder: Secondary | ICD-10-CM | POA: Diagnosis not present

## 2022-05-05 NOTE — Progress Notes (Unsigned)
Medical Nutrition Therapy Appt start time: 1000 end time: 1100 (1 hour) Primary concerns today: Weight management.  PCP [facility], [place], [address]   Relevant history/background: Gearldine was seen by me for MNT many years ago for anorexia nervosa, which she said resolved in her 29's.  She presents today for weight management (06-27-21: wt 298 lb; BMI 40; ht 69").  Other diagnoses include prediabetes and HTN.  Follows a lacto-ovo vegetarian diet.  Lavida gained weight during her last job with [employer] while also helping care for her mom who had cancer.  She then gained ~80 lb in the 5 months after her mom died.  She has been on sabbatical from her job since 02/04/22, so is now able to focus time and energy on self-care.    Assessment:  Lashan has been documented progress on her goals. She has cooked more meals at home, which she has found satisfying.  She's also been consistent with both PT exercises and walking 30 min 5 X wk.  Learning Readiness: Change in progress:  Has been moving more, and is more mindful with respect to food choices.     Weight: 282.4 lb; 285.8 lb on 04/21/22 (Ht is 69").   Usual eating pattern: 3 meals and 0-1 snack per day. Avoided foods: meat and flesh foods, salty foods, butter, margarine, beets, Br sprouts.   Usual physical activity: Walks 30 min 5 X wk (since October 2023).  PT exercises for L shoulder, twice daily.   Sleep: Estimates she gets 7-7 1/2 hrs per night.    24-hr recall:  (Up at 7:30 AM) B (8 AM)-  2 scrmbld eggs, 1 toast, 1/2 tomato, 1 banana, water Snk ( AM)-   L (12:30 PM)-  1 side salad, 1/2 c walnuts, 1 c peas, 1 tbsp low-fat vinaigrette, 1 baked potato, lite sour crm, 1/2 c frzn fruit, water,  Snk ( PM)-  Diet Coke, water D (6 PM)-  1 c wild rice, 1 1/2 c blk beans, 1/4 c shredded chs, 1/2 c frzn fruit, water Snk ( PM)-  1 frzn yogurt bar (100 kcal) Typical day? Yes.     Nutritional Diagnosis: Progress noted on NI-5.8.3  Inappropriate intake of types of carbohydrates (specify):   As related to inadequate vegetable intake.  As evidenced by meeting behavioral goals each of the past two weeks.   Handouts given during visit include: After-Visit Summary (AVS) Goals tracker form   Demonstrated degree of understanding via:  Teach Back  Barriers to learning/adherence to lifestyle change: Longstanding dietary habits (although may be offset by patient's apparent motivation for change.)  Monitoring/Evaluation:  Dietary intake, exercise, and body weight in 6 week(s).

## 2022-05-06 ENCOUNTER — Encounter: Payer: Self-pay | Admitting: Family Medicine

## 2022-05-06 ENCOUNTER — Ambulatory Visit (INDEPENDENT_AMBULATORY_CARE_PROVIDER_SITE_OTHER): Payer: BC Managed Care – PPO | Admitting: Family Medicine

## 2022-05-06 VITALS — Ht 69.0 in | Wt 282.4 lb

## 2022-05-06 DIAGNOSIS — Z6841 Body Mass Index (BMI) 40.0 and over, adult: Secondary | ICD-10-CM

## 2022-05-06 DIAGNOSIS — R7303 Prediabetes: Secondary | ICD-10-CM

## 2022-05-06 NOTE — Patient Instructions (Addendum)
Book recommendation for Holly Maynard: The Tender Bar by [employer].    Tap water: Try pouring water, then let it sit uncovered overnight.    Behavioral Goals:  1. Eat at least 3 REAL meals and 0-1 snack per day.  Eat breakfast within one hour of getting up.  Aim for no more than 5 hours between eating.   A REAL meal includes at least some protein, some starch, and vegetables and/or fruit.   (OR: Would you serve this to a guest in your home, and call it a meal?)    - Ideally, for each lunch and dinner, you will have twice the volume of veg's as either protein or starch.   2. Have at least 8 home-prepared lunches and dinners each week.    3. Walk 30 minutes 5 times a week AND do daily PT exercises as prescribed.    Document progress on your above goals using the form provided today.   Suggestion:  To document your REAL meals and home-prep'd meals, write in L or D for lunch or dinner, and record # of minutes you walk.  (And bring your form to follow-up.)      Follow-up appt on Monday, January 22 at 11 AM.

## 2022-05-12 DIAGNOSIS — M25512 Pain in left shoulder: Secondary | ICD-10-CM | POA: Diagnosis not present

## 2022-05-27 DIAGNOSIS — M25512 Pain in left shoulder: Secondary | ICD-10-CM | POA: Diagnosis not present

## 2022-06-02 DIAGNOSIS — M25512 Pain in left shoulder: Secondary | ICD-10-CM | POA: Diagnosis not present

## 2022-06-09 DIAGNOSIS — M25512 Pain in left shoulder: Secondary | ICD-10-CM | POA: Diagnosis not present

## 2022-06-16 ENCOUNTER — Encounter: Payer: Self-pay | Admitting: Family Medicine

## 2022-06-16 ENCOUNTER — Ambulatory Visit (INDEPENDENT_AMBULATORY_CARE_PROVIDER_SITE_OTHER): Payer: BC Managed Care – PPO | Admitting: Family Medicine

## 2022-06-16 VITALS — Ht 69.0 in | Wt 276.2 lb

## 2022-06-16 DIAGNOSIS — R7303 Prediabetes: Secondary | ICD-10-CM

## 2022-06-16 NOTE — Patient Instructions (Signed)
Behavioral Goals:  1. Eat at least 3 REAL meals and 0-1 snack per day.  Eat breakfast within one hour of getting up.  Aim for no more than 5 hours between eating.   A REAL lunch or dinner includes at least some protein, some starch, and vegetables.      - Ideally, for each lunch and dinner, you'll have twice the volume of veg's as protein or starch foods.  2. Have at least 8 home-prepared lunches and dinners each week.   3. Walk 30 minutes 5 times a week AND do daily PT exercises as prescribed.    Now while you have some available time, you may want to explore some new recipes.    Document progress on your above goals using the form provided today.    Follow-up appt on Monday, Feb 19 at 1:30 PM.

## 2022-06-16 NOTE — Progress Notes (Signed)
Medical Nutrition Therapy Appt start time: 1100 end time: 2376 (45 minutes) Primary concerns today: Weight management.  PCP [facility], [place], [address]   Relevant history/background: Holly Maynard was seen by me for MNT many years ago for anorexia nervosa, which she said resolved in her 5's.  She presents today for weight management (06-27-21: wt 298 lb; BMI 40; ht 69").  Other diagnoses include prediabetes and HTN.  Follows a lacto-ovo vegetarian diet.  Holly Maynard gained weight during her last job with [employer] while also helping care for her mom who had cancer.  She then gained ~80 lb in the 5 months after her mom died.  She has been on sabbatical from her job since 02/04/22, so is now able to focus time and energy on self-care.    Assessment:  Holly Maynard received an injection in her L shoulder for impingement on 12/29, which has diminished pain and increased ROM.  She has gone to weekly PT 12 X and is doing home 30 minutes of PT exercises 4 X wk.  She has consistently documented progress on her behavioral goals, which shows good adherence to recommended goals.  Holly Maynard said she has not found the food recommendations difficult to follow; is satisfied with food choices, and notices feeling better overall.     Weight: 276.2 lb; 282.4 lb on 05/06/22 (Ht is 69").   Usual eating pattern: 3 meals and 0-1 snack per day. Avoided foods: meat and flesh foods, salty foods, butter, margarine, beets, Br sprouts.   Usual physical activity: Walks 30 min 5-6 X wk (since October 2023).  ~30 min PT exercises for L shoulder, 4 X wk.  Also tries to be intermittently active.   Sleep: Estimates she gets 7-7 1/2 hrs per night.  Doing a bit better now that shoulder is less painful.    24-hr recall:  (Up at 7:30 AM) B (8 AM)-  1 scrmbld egg, 1 slc toast, 1/2 tomato, 1 c berries, water. 1 c coffee  Snk ( AM)-   L (1 PM)-  1 large spinach salad, 1 c green peas, 1 tbsp chs, 1 tbsp drsng, 1 banana, water,  Snk (3  PM)-  Diet Coke D (6 PM)-  1 1/2 c veg-based casserole (lima beans, corn, grn beans, onion, chs), water, 1 c fruit Snk (8:30)-  1 100-kcal snack pack of popcorn, water Typical day? Yes.     Nutritional Diagnosis: Continued progress noted on NI-5.8.3 Inappropriate intake of types of carbohydrates (specify):   As related to inadequate vegetable intake.  As evidenced by meeting most behavioral goals over the past several weeks.   Handouts given during visit include: After-Visit Summary (AVS) Goals tracker form   Demonstrated degree of understanding via:  Teach Back  Barriers to learning/adherence to lifestyle change: Longstanding dietary habits (although may be offset by patient's apparent motivation for change.)  Monitoring/Evaluation:  Dietary intake, exercise, and body weight in 4 week(s).

## 2022-07-14 ENCOUNTER — Ambulatory Visit: Payer: BC Managed Care – PPO | Admitting: Family Medicine

## 2022-08-06 ENCOUNTER — Encounter: Payer: Self-pay | Admitting: Family Medicine

## 2022-08-18 ENCOUNTER — Telehealth: Payer: Self-pay

## 2022-08-18 NOTE — Telephone Encounter (Signed)
We received a prescription refill request from [facility] via fax for Hydrochlorothiazide.  The fax is addressed to [facility].  I called patient to find out if she would like to continue to receive primary care from [facility] or if she is planning to receive primary care elsewhere. Patient states she is planning to stay with [facility], but she would like to call us back later this week to reschedule her TOC appointment.

## 2022-10-29 ENCOUNTER — Ambulatory Visit: Payer: BC Managed Care – PPO | Admitting: Family Medicine

## 2022-10-29 ENCOUNTER — Encounter: Payer: Self-pay | Admitting: Family Medicine

## 2022-10-29 VITALS — BP 152/88 | HR 80 | Ht 69.0 in | Wt 255.0 lb

## 2022-10-29 DIAGNOSIS — Z114 Encounter for screening for human immunodeficiency virus [HIV]: Secondary | ICD-10-CM

## 2022-10-29 DIAGNOSIS — E559 Vitamin D deficiency, unspecified: Secondary | ICD-10-CM | POA: Diagnosis not present

## 2022-10-29 DIAGNOSIS — I10 Essential (primary) hypertension: Secondary | ICD-10-CM

## 2022-10-29 DIAGNOSIS — Z789 Other specified health status: Secondary | ICD-10-CM

## 2022-10-29 DIAGNOSIS — Z23 Encounter for immunization: Secondary | ICD-10-CM

## 2022-10-29 DIAGNOSIS — Z1211 Encounter for screening for malignant neoplasm of colon: Secondary | ICD-10-CM

## 2022-10-29 DIAGNOSIS — Z1231 Encounter for screening mammogram for malignant neoplasm of breast: Secondary | ICD-10-CM

## 2022-10-29 DIAGNOSIS — E538 Deficiency of other specified B group vitamins: Secondary | ICD-10-CM

## 2022-10-29 DIAGNOSIS — Z1159 Encounter for screening for other viral diseases: Secondary | ICD-10-CM

## 2022-10-29 NOTE — Progress Notes (Signed)
SUBJECTIVE:   Chief Complaint  Patient presents with   Transitions Of Care   Insomnia   HPI Presents to clinic to establish care  No acute concerns today  Hypertension Asymptomatic.  Was previously prescribed HydroDIURIL 25 mg daily but has not taken this in over 6 months.  Denies any headaches, visual changes, chest pain, increased heart rate, shortness of breath or lower extremity edema.    Reports history of depression.  Self diagnosed and never treated. Denies SI/HI  Eating disorder History of anorexia nervosa in her 30s.  She is now following with Dr. Danie Chandler, RD, at [facility], for weight management.  Endorses significant weight gain while caring for mother and previous job.  She then gained an additional 80 lbs after her mother had passed.  She is currently working hard to loose weight and is making healthy choices.  She maintains a lacto- ovo vegetarian diet and avoids meat and flesh foods, salty foods, beets, Br sprouts, margarine and butter.  She eats 3 meals a day and has resumed walking 30 minutes 5-6 times a week.  PERTINENT PMH / PSH: Hypertension Mood disorder Prediabetes  Maternal history breast cancer at age 63, lungs CTA, skin CA, hypertension Paternal history insignificant   OBJECTIVE:  BP (!) 152/88   Pulse 80   Ht 5\' 9"  (1.753 m)   Wt 255 lb (115.7 kg)   LMP  (LMP Unknown)   SpO2 97%   BMI 37.66 kg/m    Physical Exam Constitutional:      General: She is not in acute distress.    Appearance: She is obese. She is not ill-appearing.  HENT:     Head: Normocephalic.  Eyes:     Conjunctiva/sclera: Conjunctivae normal.  Cardiovascular:     Rate and Rhythm: Normal rate and regular rhythm.     Pulses: Normal pulses.  Pulmonary:     Effort: Pulmonary effort is normal.     Breath sounds: Normal breath sounds.  Abdominal:     General: Bowel sounds are normal.  Neurological:     Mental Status: She is alert. Mental status is  at baseline.  Psychiatric:        Mood and Affect: Mood normal.        Behavior: Behavior normal.        Thought Content: Thought content normal.        Judgment: Judgment normal.     ASSESSMENT/PLAN:  Primary hypertension Assessment & Plan: Chronic.  Elevated today and again on repeat.  Has not been taking hydrochlorothiazide as previously prescribed. Asymptomatic Check c-Met, if kidney functions okay will restart hydrochlorothiazide Could consider amlodipine if Cr elevated. Monitor blood pressure at home, goal less than 140/90 Follow up in 1 month  Orders: -     Comprehensive metabolic panel; Future  Morbid obesity (HCC) Assessment & Plan: BMI elevated with comorbidities of hypertension and hyperlipidemia Check obesity labs today Consider GLP-1 in future Continue to follow with RD as scheduled    Orders: -     CBC with Differential/Platelet; Future -     Lipid panel; Future -     Hemoglobin A1c; Future -     TSH; Future  Colon cancer screening -     Ambulatory referral to Gastroenterology -     Cologuard  Breast cancer screening by mammogram -     3D Screening Mammogram, Left and Right; Future  Vitamin D deficiency Assessment & Plan: Check Vitamin D  level  Orders: -     VITAMIN D 25 Hydroxy (Vit-D Deficiency, Fractures); Future  Vegetarian diet Assessment & Plan: Check vitamin B 12 level   Encounter for vaccination -     Tdap vaccine greater than or equal to 7yo IM  Vitamin B 12 deficiency Assessment & Plan: Check Vitamin B 12 level  Orders: -     Vitamin B12; Future  HCM Recommend shingles vaccine Colonoscopy referral sent, previous Cologuard 2020 negative. Mammogram referral sent today Declined hepatitis C/HIV screening.  Discontinued per patient's request Last Pap 03/22.  Request records from OB/GYN Tetanus booster administered.  Due 10/2032 DEXA at age 55  PDMP reviewed  Return in about 1 month (around 11/28/2022).  Dana Allan, MD

## 2022-10-29 NOTE — Patient Instructions (Addendum)
It was a pleasure meeting you today. Thank you for allowing me to take part in your health care.  Our goals for today as we discussed include:  Schedule appointment for blood work.  Fast for 10 hours  Referral sent for Mammogram. Please call to schedule appointment. [facility] [address] [place], [place] [address] [phone]    Cologuard is ordered  Follow up with OBGYN for PAP.  If not able to get appointment please schedule with PCP  Received Tetanus today.  Due ever 10 years  Recommend Shingles vaccine.  This is a 2 dose series and can be given at your local pharmacy.  Please talk to your pharmacist about this.    If you have any questions or concerns, please do not hesitate to call the office at [phone].  I look forward to our next visit and until then take care and stay safe.  Regards,   Dana Allan, MD   [facility]

## 2022-11-06 ENCOUNTER — Encounter: Payer: Self-pay | Admitting: Family Medicine

## 2022-11-07 ENCOUNTER — Other Ambulatory Visit: Payer: BC Managed Care – PPO

## 2022-11-07 NOTE — Telephone Encounter (Signed)
Is it okay to cancel the HIV and Hep C lab draw?

## 2022-11-08 ENCOUNTER — Encounter: Payer: Self-pay | Admitting: Family Medicine

## 2022-11-08 DIAGNOSIS — Z23 Encounter for immunization: Secondary | ICD-10-CM | POA: Insufficient documentation

## 2022-11-08 DIAGNOSIS — Z1211 Encounter for screening for malignant neoplasm of colon: Secondary | ICD-10-CM | POA: Insufficient documentation

## 2022-11-08 DIAGNOSIS — Z1231 Encounter for screening mammogram for malignant neoplasm of breast: Secondary | ICD-10-CM | POA: Insufficient documentation

## 2022-11-08 DIAGNOSIS — E538 Deficiency of other specified B group vitamins: Secondary | ICD-10-CM | POA: Insufficient documentation

## 2022-11-08 NOTE — Assessment & Plan Note (Signed)
Check Vitamin B 12 level 

## 2022-11-08 NOTE — Assessment & Plan Note (Signed)
Check Vitamin D level 

## 2022-11-08 NOTE — Assessment & Plan Note (Signed)
BMI elevated with comorbidities of hypertension and hyperlipidemia Check obesity labs today Consider GLP-1 in future Continue to follow with RD as scheduled

## 2022-11-08 NOTE — Assessment & Plan Note (Signed)
Check vitamin B12 level 

## 2022-11-08 NOTE — Assessment & Plan Note (Addendum)
Chronic.  Elevated today and again on repeat.  Has not been taking hydrochlorothiazide as previously prescribed. Asymptomatic Check c-Met, if kidney functions okay will restart hydrochlorothiazide Could consider amlodipine if Cr elevated. Monitor blood pressure at home, goal less than 140/90 Follow up in 1 month

## 2022-11-11 ENCOUNTER — Encounter: Payer: Self-pay | Admitting: *Deleted

## 2022-11-12 ENCOUNTER — Other Ambulatory Visit (INDEPENDENT_AMBULATORY_CARE_PROVIDER_SITE_OTHER): Payer: BC Managed Care – PPO

## 2022-11-12 DIAGNOSIS — E559 Vitamin D deficiency, unspecified: Secondary | ICD-10-CM | POA: Diagnosis not present

## 2022-11-12 DIAGNOSIS — I10 Essential (primary) hypertension: Secondary | ICD-10-CM | POA: Diagnosis not present

## 2022-11-12 DIAGNOSIS — E538 Deficiency of other specified B group vitamins: Secondary | ICD-10-CM

## 2022-11-12 LAB — COMPREHENSIVE METABOLIC PANEL
ALT: 19 U/L (ref 0–35)
AST: 15 U/L (ref 0–37)
Albumin: 4.2 g/dL (ref 3.5–5.2)
Alkaline Phosphatase: 71 U/L (ref 39–117)
BUN: 13 mg/dL (ref 6–23)
CO2: 30 mEq/L (ref 19–32)
Calcium: 9.7 mg/dL (ref 8.4–10.5)
Chloride: 102 mEq/L (ref 96–112)
Creatinine, Ser: 0.62 mg/dL (ref 0.40–1.20)
GFR: 98.3 mL/min (ref >60.00)
Glucose, Bld: 102 mg/dL — ABNORMAL HIGH (ref 70–99)
Potassium: 4.2 mEq/L (ref 3.5–5.1)
Sodium: 140 mEq/L (ref 135–145)
Total Bilirubin: 0.5 mg/dL (ref 0.2–1.2)
Total Protein: 7.3 g/dL (ref 6.0–8.3)

## 2022-11-12 LAB — CBC WITH DIFFERENTIAL/PLATELET
Basophils Absolute: 0 10*3/uL (ref 0.0–0.1)
Basophils Relative: 0.5 % (ref 0.0–3.0)
Eosinophils Absolute: 0.1 10*3/uL (ref 0.0–0.7)
Eosinophils Relative: 1.5 % (ref 0.0–5.0)
HCT: 45 % (ref 36.0–46.0)
Hemoglobin: 15 g/dL (ref 12.0–15.0)
Lymphocytes Relative: 24 % (ref 12.0–46.0)
Lymphs Abs: 2.1 10*3/uL (ref 0.7–4.0)
MCHC: 33.3 g/dL (ref 30.0–36.0)
MCV: 85.7 fl (ref 78.0–100.0)
Monocytes Absolute: 0.5 10*3/uL (ref 0.1–1.0)
Monocytes Relative: 6.3 % (ref 3.0–12.0)
Neutro Abs: 5.8 10*3/uL (ref 1.4–7.7)
Neutrophils Relative %: 67.7 % (ref 43.0–77.0)
Platelets: 277 10*3/uL (ref 150.0–400.0)
RBC: 5.25 Mil/uL — ABNORMAL HIGH (ref 3.87–5.11)
RDW: 14.2 % (ref 11.5–15.5)
WBC: 8.6 10*3/uL (ref 4.0–10.5)

## 2022-11-12 LAB — TSH: TSH: 1.16 u[IU]/mL (ref 0.35–5.50)

## 2022-11-12 LAB — LIPID PANEL
Cholesterol: 145 mg/dL (ref 0–200)
HDL: 37.1 mg/dL — ABNORMAL LOW (ref >39.00)
LDL Cholesterol: 79 mg/dL (ref 0–99)
NonHDL: 107.41
Total CHOL/HDL Ratio: 4
Triglycerides: 143 mg/dL (ref 0.0–149.0)
VLDL: 28.6 mg/dL (ref 0.0–40.0)

## 2022-11-12 LAB — HEMOGLOBIN A1C: Hgb A1c MFr Bld: 5.8 % (ref 4.6–6.5)

## 2022-11-12 LAB — VITAMIN D 25 HYDROXY (VIT D DEFICIENCY, FRACTURES): VITD: 78.64 ng/mL (ref 30.00–100.00)

## 2022-11-12 LAB — VITAMIN B12: Vitamin B-12: 536 pg/mL (ref 211–911)

## 2022-11-23 ENCOUNTER — Encounter: Payer: Self-pay | Admitting: Family Medicine

## 2022-12-02 ENCOUNTER — Ambulatory Visit: Payer: BC Managed Care – PPO | Admitting: Family Medicine

## 2022-12-03 MED ORDER — AMLODIPINE BESYLATE 5 MG PO TABS: 5.0000 mg | ORAL_TABLET | Freq: Every evening | ORAL | 0 refills | Status: DC

## 2022-12-11 ENCOUNTER — Ambulatory Visit: Payer: Self-pay | Admitting: Dermatology

## 2022-12-15 ENCOUNTER — Ambulatory Visit: Payer: BC Managed Care – PPO | Admitting: Family Medicine

## 2022-12-15 VITALS — BP 160/100 | HR 88 | Temp 98.6°F | Ht 69.0 in | Wt 248.0 lb

## 2022-12-15 DIAGNOSIS — Z6836 Body mass index (BMI) 36.0-36.9, adult: Secondary | ICD-10-CM | POA: Diagnosis not present

## 2022-12-15 DIAGNOSIS — I10 Essential (primary) hypertension: Secondary | ICD-10-CM

## 2022-12-15 MED ORDER — INDAPAMIDE 1.25 MG PO TABS
1.2500 mg | ORAL_TABLET | Freq: Every day | ORAL | 0 refills | Status: DC
Start: 2022-12-15 — End: 2023-01-14

## 2022-12-15 NOTE — Progress Notes (Signed)
   SUBJECTIVE:   Chief Complaint  Patient presents with   Medical Management of Chronic Issues    B/P follow up. She has been taking her B/P at home and it has been in the 160s/ 90s   HPI Presents to clinic for follow up blood pressure  No acute concerns today  Hypertension Asymptomatic.  Blood pressure continued to be elevated at home after initial clinic visit so Amlodipine 5 mg at night and tolerating medication well.  BP remains elevated.  Was previously on diuretic and agreeable to restart.   Has increased activity  Eating disorder Continues to follows with Dr Gerilyn Pilgrim.  Weight has decreased 7 lbs since last visit.  Has increased activity and tolerating well.  Plans to continue with current regimen as this has been effective      PERTINENT PMH / PSH: Hypertension Mood disorder Prediabetes   OBJECTIVE:  BP (!) 160/100 (BP Location: Left Arm, Patient Position: Sitting, Cuff Size: Normal)   Pulse 88   Temp 98.6 F (37 C) (Oral)   Ht 5\' 9"  (1.753 m)   Wt 248 lb (112.5 kg)   LMP  (LMP Unknown)   SpO2 98%   BMI 36.62 kg/m    Physical Exam Constitutional:      General: She is not in acute distress.    Appearance: She is obese. She is not ill-appearing.  HENT:     Head: Normocephalic.  Eyes:     Conjunctiva/sclera: Conjunctivae normal.  Cardiovascular:     Rate and Rhythm: Normal rate and regular rhythm.     Pulses: Normal pulses.  Pulmonary:     Effort: Pulmonary effort is normal.     Breath sounds: Normal breath sounds.  Abdominal:     General: Bowel sounds are normal.  Neurological:     Mental Status: She is alert. Mental status is at baseline.  Psychiatric:        Mood and Affect: Mood normal.        Behavior: Behavior normal.        Thought Content: Thought content normal.        Judgment: Judgment normal.    ASSESSMENT/PLAN:  Primary hypertension Assessment & Plan: Chronic.  Remains elevated.  Tolerating current medication Continue Amlodipine 5 mg  at night Start Indapamide 1.25 mg daily Recheck Bmet in 1 week Continue lifestyle changes and activity Follow up in 3 months or sooner if BP remains elevated >140/90   Orders: -     Indapamide; Take 1 tablet (1.25 mg total) by mouth daily.  Dispense: 30 tablet; Refill: 0 -     Basic metabolic panel; Future  Morbid obesity (HCC) Assessment & Plan: BMI elevated with comorbidities of hypertension and hyperlipidemia Weight has decreased 7 lbs  Activity has increased Encouraged to continue to follow up with RD and to continue current activity        HCM Recommend shingles vaccine Colonoscopy, waiting to be scheduled, previous Cologuard 2020 negative. Mammogram referral sent.  Patient to schedule Declined hepatitis C/HIV screening.  Discontinued per patient's request Last Pap 03/22.  Request records from OB/GYN Tetanus booster administered.  Due 10/2032 DEXA at age 48  PDMP reviewed  Return if symptoms worsen or fail to improve, for PCP.  Dana Allan, MD

## 2022-12-15 NOTE — Patient Instructions (Addendum)
It was a pleasure meeting you today. Thank you for allowing me to take part in your health care.  Our goals for today as we discussed include:  Continue Amlodipine 5 mg at night Start Indapamide 1.25 mg daily Monitor Blood pressure at home.    Schedule lab appointment for next Tues  Congrats on the weight loss and continue to follow up with Dr Gerilyn Pilgrim   Follow up as needed   If you have any questions or concerns, please do not hesitate to call the office at [phone].  I look forward to our next visit and until then take care and stay safe.  Regards,   Dana Allan, MD   [facility]

## 2022-12-17 ENCOUNTER — Telehealth: Payer: Self-pay | Admitting: Family Medicine

## 2022-12-17 NOTE — Telephone Encounter (Signed)
Patient need lab orders.

## 2022-12-23 ENCOUNTER — Other Ambulatory Visit (INDEPENDENT_AMBULATORY_CARE_PROVIDER_SITE_OTHER): Payer: BC Managed Care – PPO

## 2022-12-23 DIAGNOSIS — I10 Essential (primary) hypertension: Secondary | ICD-10-CM

## 2022-12-23 LAB — BASIC METABOLIC PANEL
BUN: 13 mg/dL (ref 6–23)
CO2: 34 mEq/L — ABNORMAL HIGH (ref 19–32)
Calcium: 10 mg/dL (ref 8.4–10.5)
Chloride: 99 mEq/L (ref 96–112)
Creatinine, Ser: 0.6 mg/dL (ref 0.40–1.20)
GFR: 99 mL/min (ref >60.00)
Glucose, Bld: 97 mg/dL (ref 70–99)
Potassium: 3.6 mEq/L (ref 3.5–5.1)
Sodium: 139 mEq/L (ref 135–145)

## 2022-12-25 ENCOUNTER — Ambulatory Visit (INDEPENDENT_AMBULATORY_CARE_PROVIDER_SITE_OTHER): Payer: BC Managed Care – PPO | Admitting: Family Medicine

## 2022-12-25 VITALS — Ht 69.0 in | Wt 247.8 lb

## 2022-12-25 DIAGNOSIS — R7303 Prediabetes: Secondary | ICD-10-CM

## 2022-12-25 DIAGNOSIS — E669 Obesity, unspecified: Secondary | ICD-10-CM

## 2022-12-25 NOTE — Progress Notes (Signed)
Medical Nutrition Therapy Appt start time: 1030 end time: 1130 (45 minutes) Primary concerns today: Weight management.  PCP Dana Allan, MD; [facility], [place], [address]   Relevant history/background: Holly Maynard was seen by me for MNT many years ago for anorexia nervosa, which she said resolved in her 22's.  She presents today for weight management (06-27-21: wt 298 lb; BMI 40; ht 69").  Other diagnoses include prediabetes and HTN.  Follows a lacto-ovo vegetarian diet.  Holly Maynard gained weight during her last job with [employer] while also helping care for her mom, who had cancer.  She then gained ~80 lb in the 5 months after her mom died.    Assessment:  Holly Maynard's labs on 11/12/22 were mostly in normal range with exceptions of: HDL 37.1 mg/dL and Vit D 08.65 ng/mL.  A1c was 5.8%, down from 6.1 on 05/02/21.  Dr. Clent Ridges prescribed amlodipine 5 mg and Indapamide 1.25 mg; patient's home BP checks have shown SBP to be 150's to 160's.    Weight: 247.8 lb; 248 lb on 12/15/22 (down from 285 on 04/22/23).  Ht is 69".   Usual eating pattern: 3 meals and 0-1 snack per day. Avoided foods: meat and flesh foods, salty foods, butter, margarine, beets, Br sprouts.   Usual physical activity: Walks 30-40 min 5-6 X wk, 20-30 min PT exercises for L shoulder, 2 X wk.  Also includes some stretching and light weights for upper body.  Sleep: Estimates average of 7-7 1/2 hrs per night.  Waking a bit less often, but still several times per night, although no difficulty getting back to sleep.    24-hr recall  (Up at 7:30 AM) B (8:15 AM)-  2 eggs, 1 slc toast, pineapple, kiwi, 1 c blueberries, 1/2 tomato, water Snk ( AM)-  --- L (1 PM)-  1 c ricotta-stuffed pasta, 2 c carrots&squash, water, 8 oz diet Coke Snk ( PM)-  --- D (6:15 PM)-  1 large salad, 1/2 c low-fat chs (80 kcal; ?? g protein), 1 tbsp walnuts, 2 tbsp drsng (100 kcal), water, 8 oz diet Coke Snk ( PM)-  1 [employer] yog bar 25% less sugar (80  kcal) Typical day? Yes.   Usually has herb tea or coffee every other day or so.    Nutritional Diagnosis: Continued progress noted on NI-5.8.3 Inappropriate intake of types of carbohydrates (specify):   As related to inadequate vegetable intake.  As evidenced by meeting most behavioral goals over the past several weeks.   Handouts given during visit include: After-Visit Summary (AVS) Revised Goals Sheet  Monitoring/Evaluation:  Dietary intake, exercise, and body weight in 8 week(s).

## 2022-12-25 NOTE — Patient Instructions (Addendum)
Look up [employer].  (See [employer].)   There may be a benefit to sequential eating: Consume the low-calorie-dense foods first.    Your vitamin D level is pretty high.  No need to take supplements unless it goes below 30 ng/mL.  Monitoring annually will let you know if you need to restart supplements.  A reasonable goal is to maintain blood levels of between 30 and 40 ng/mL.    Congratulations on your consistently good food choices and physical activity.  Continued weight loss as well as more exercise will likely help increase your HDL.  Both of these factors are also associated with reducing blood pressure, so continuing the good health behaviors you've been doing should be helpful.    Dietary factors that can help lower BP include limiting sodium intake (aim for <1500 mg/day) and making sure you're getting adequate potassium, which you can achieve through an abundance of fruits and vegetables.  Remember, ALL fruits and veg's are good to excellent sources of potassium.  One other important dietary consideration in managing blood pressure is to emphasize whole, real foods vs. highly processed foods.    Sleep: Do not use the bathroom unless you need it b/c if you do each time you wake up, you are conditioning yourself to having to do so.    Behavioral Goals:  1. Have twice the volume of veg's as protein or starch foods in at least 10 meals per week.  2. Walk 45 minutes at least 3 times a week and a walk that includes up to 7 minutes high-intensity intervals at least 3 times a week.   3. Obtain 20-25 grams of protein per meal.       - A weight of 175 lb = ~80 kg.  The protein RDA = 0.8 X body weight in kg, but there is increasing evidence that 1 g/kg is better for individuals >60 YO.       - Protein focus: Check food labels for most protein sources.   1 egg or 1 ounce of meat, fish, poultry, or cheese provides about 7 grams of protein.  8 oz of soy milk = ~6-7 g protein; 2 tbsp  peanut butter provides 8 grams of protein. 1 cup (8 oz) of most beans provides 15 grams of protein.  If using beans as your protein source for that meal, always have at least one full cup.   Yogurts vary greatly, but [place] yogurt is highest in protein.   Document progress on the above goals using your Goals Sheet.    Follow-up appt on Thursday, September, 26 at 11 AM.

## 2022-12-28 ENCOUNTER — Encounter: Payer: Self-pay | Admitting: Family Medicine

## 2022-12-28 NOTE — Assessment & Plan Note (Signed)
BMI elevated with comorbidities of hypertension and hyperlipidemia Weight has decreased 7 lbs  Activity has increased Encouraged to continue to follow up with RD and to continue current activity

## 2022-12-28 NOTE — Assessment & Plan Note (Addendum)
Chronic.  Remains elevated.  Tolerating current medication Continue Amlodipine 5 mg at night Start Indapamide 1.25 mg daily Recheck Bmet in 1 week Continue lifestyle changes and activity Follow up in 3 months or sooner if BP remains elevated >140/90

## 2023-01-02 ENCOUNTER — Other Ambulatory Visit: Payer: Self-pay | Admitting: Family Medicine

## 2023-01-05 ENCOUNTER — Other Ambulatory Visit: Payer: Self-pay | Admitting: Family Medicine

## 2023-01-11 ENCOUNTER — Other Ambulatory Visit: Payer: Self-pay | Admitting: Family Medicine

## 2023-01-11 DIAGNOSIS — I10 Essential (primary) hypertension: Secondary | ICD-10-CM

## 2023-01-12 ENCOUNTER — Encounter: Payer: Self-pay | Admitting: Dermatology

## 2023-01-12 ENCOUNTER — Ambulatory Visit: Payer: BC Managed Care – PPO | Admitting: Dermatology

## 2023-01-12 DIAGNOSIS — D229 Melanocytic nevi, unspecified: Secondary | ICD-10-CM

## 2023-01-12 DIAGNOSIS — L578 Other skin changes due to chronic exposure to nonionizing radiation: Secondary | ICD-10-CM | POA: Diagnosis not present

## 2023-01-12 DIAGNOSIS — D492 Neoplasm of unspecified behavior of bone, soft tissue, and skin: Secondary | ICD-10-CM

## 2023-01-12 DIAGNOSIS — D2262 Melanocytic nevi of left upper limb, including shoulder: Secondary | ICD-10-CM

## 2023-01-12 DIAGNOSIS — D1801 Hemangioma of skin and subcutaneous tissue: Secondary | ICD-10-CM

## 2023-01-12 DIAGNOSIS — Z1283 Encounter for screening for malignant neoplasm of skin: Secondary | ICD-10-CM | POA: Diagnosis not present

## 2023-01-12 DIAGNOSIS — L814 Other melanin hyperpigmentation: Secondary | ICD-10-CM | POA: Diagnosis not present

## 2023-01-12 DIAGNOSIS — W908XXA Exposure to other nonionizing radiation, initial encounter: Secondary | ICD-10-CM

## 2023-01-12 DIAGNOSIS — D239 Other benign neoplasm of skin, unspecified: Secondary | ICD-10-CM

## 2023-01-12 DIAGNOSIS — Z808 Family history of malignant neoplasm of other organs or systems: Secondary | ICD-10-CM

## 2023-01-12 DIAGNOSIS — D225 Melanocytic nevi of trunk: Secondary | ICD-10-CM

## 2023-01-12 DIAGNOSIS — L821 Other seborrheic keratosis: Secondary | ICD-10-CM

## 2023-01-12 HISTORY — DX: Other benign neoplasm of skin, unspecified: D23.9

## 2023-01-12 NOTE — Patient Instructions (Addendum)
 Wound Care Instructions  Cleanse wound gently with soap and water once a day then pat dry with clean gauze. Apply a thin coat of Petrolatum (petroleum jelly, "Vaseline") over the wound (unless you have an allergy to this). We recommend that you use a new, sterile tube of Vaseline. Do not pick or remove scabs. Do not remove the yellow or white "healing tissue" from the base of the wound.  Cover the wound with fresh, clean, nonstick gauze and secure with paper tape. You may use Band-Aids in place of gauze and tape if the wound is small enough, but would recommend trimming much of the tape off as there is often too much. Sometimes Band-Aids can irritate the skin.  You should call the office for your biopsy report after 1 week if you have not already been contacted.  If you experience any problems, such as abnormal amounts of bleeding, swelling, significant bruising, significant pain, or evidence of infection, please call the office immediately.  FOR ADULT SURGERY PATIENTS: If you need something for pain relief you may take 1 extra strength Tylenol (acetaminophen) AND 2 Ibuprofen (200mg  each) together every 4 hours as needed for pain. (do not take these if you are allergic to them or if you have a reason you should not take them.) Typically, you may only need pain medication for 1 to 3 days.     Recommend daily broad spectrum sunscreen SPF 30+ to sun-exposed areas, reapply every 2 hours as needed. Call for new or changing lesions.  Staying in the shade or wearing long sleeves, sun glasses (UVA+UVB protection) and wide brim hats (4-inch brim around the entire circumference of the hat) are also recommended for sun protection.    Melanoma ABCDEs  Melanoma is the most dangerous type of skin cancer, and is the leading cause of death from skin disease.  You are more likely to develop melanoma if you: Have light-colored skin, light-colored eyes, or red or blond hair Spend a lot of time in the sun Tan  regularly, either outdoors or in a tanning bed Have had blistering sunburns, especially during childhood Have a close family member who has had a melanoma Have atypical moles or large birthmarks  Early detection of melanoma is key since treatment is typically straightforward and cure rates are extremely high if we catch it early.   The first sign of melanoma is often a change in a mole or a new dark spot.  The ABCDE system is a way of remembering the signs of melanoma.  A for asymmetry:  The two halves do not match. B for border:  The edges of the growth are irregular. C for color:  A mixture of colors are present instead of an even brown color. D for diameter:  Melanomas are usually (but not always) greater than 6mm - the size of a pencil eraser. E for evolution:  The spot keeps changing in size, shape, and color.  Please check your skin once per month between visits. You can use a small mirror in front and a large mirror behind you to keep an eye on the back side or your body.   If you see any new or changing lesions before your next follow-up, please call to schedule a visit.  Please continue daily skin protection including broad spectrum sunscreen SPF 30+ to sun-exposed areas, reapplying every 2 hours as needed when you're outdoors.   Staying in the shade or wearing long sleeves, sun glasses (UVA+UVB protection) and wide brim  hats (4-inch brim around the entire circumference of the hat) are also recommended for sun protection.    Due to recent changes in healthcare laws, you may see results of your pathology and/or laboratory studies on MyChart before the doctors have had a chance to review them. We understand that in some cases there may be results that are confusing or concerning to you. Please understand that not all results are received at the same time and often the doctors may need to interpret multiple results in order to provide you with the best plan of care or course of  treatment. Therefore, we ask that you please give [place] 2 business days to thoroughly review all your results before contacting the office for clarification. Should we see a critical lab result, you will be contacted sooner.   If You Need Anything After Your Visit  If you have any questions or concerns for your doctor, please call our main line at [phone] and press option 4 to reach your doctor's medical assistant. If no one answers, please leave a voicemail as directed and we will return your call as soon as possible. Messages left after 4 pm will be answered the following business day.   You may also send [place] a message via MyChart. We typically respond to MyChart messages within 1-2 business days.  For prescription refills, please ask your pharmacy to contact our office. Our fax number is [phone].  If you have an urgent issue when the clinic is closed that cannot wait until the next business day, you can page your doctor at the number below.    Please note that while we do our best to be available for urgent issues outside of office hours, we are not available 24/7.   If you have an urgent issue and are unable to reach [place], you may choose to seek medical care at your doctor's office, retail clinic, urgent care center, or emergency room.  If you have a medical emergency, please immediately call 911 or go to the emergency department.  Pager Numbers  - Dr. Gwen Pounds: [phone]  - Dr. Roseanne Reno: [phone]  - Dr. Katrinka Blazing: [phone]   In the event of inclement weather, please call our main line at [phone] for an update on the status of any delays or closures.  Dermatology Medication Tips: Please keep the boxes that topical medications come in in order to help keep track of the instructions about where and how to use these. Pharmacies typically print the medication instructions only on the boxes and not directly on the medication tubes.   If your medication is too expensive,  please contact our office at [phone] option 4 or send [place] a message through MyChart.   We are unable to tell what your co-pay for medications will be in advance as this is different depending on your insurance coverage. However, we may be able to find a substitute medication at lower cost or fill out paperwork to get insurance to cover a needed medication.   If a prior authorization is required to get your medication covered by your insurance company, please allow [place] 1-2 business days to complete this process.  Drug prices often vary depending on where the prescription is filled and some pharmacies may offer cheaper prices.  The website www.goodrx.com contains coupons for medications through different pharmacies. The prices here do not account for what the cost may be with help from insurance (it may be cheaper with your insurance), but the website can give you  the price if you did not use any insurance.  - You can print the associated coupon and take it with your prescription to the pharmacy.  - You may also stop by our office during regular business hours and pick up a GoodRx coupon card.  - If you need your prescription sent electronically to a different pharmacy, notify our office through [facility] or by phone at [phone] option 4.     Si Usted Necesita Algo Despus de Su Visita  Tambin puede enviarnos un mensaje a travs de Clinical cytogeneticist. Por lo general respondemos a los mensajes de MyChart en el transcurso de 1 a 2 das hbiles.  Para renovar recetas, por favor pida a su farmacia que se ponga en contacto con nuestra oficina. Annie Sable de fax es [place] [phone].  Si tiene un asunto urgente cuando la clnica est cerrada y que no puede esperar hasta el siguiente da hbil, puede llamar/localizar a su doctor(a) al nmero que aparece a continuacin.   Por favor, tenga en cuenta que aunque hacemos todo lo posible para estar disponibles para asuntos urgentes fuera del  horario de [place], no estamos disponibles las 24 horas del da, los 7 [address]  [address] de la [place].   Si tiene un problema urgente y no puede comunicarse con nosotros, puede optar por buscar atencin mdica  en el consultorio de su doctor(a), en una clnica privada, en un centro de atencin urgente o en una sala de emergencias.  Si tiene Engineer, drilling, por favor llame inmediatamente al 911 o vaya a la sala de emergencias.  Nmeros de bper  - Dr. Gwen Pounds: [phone]  - Dra. Roseanne Reno: [phone]  - Dr. Katrinka Blazing: [phone]   En caso de inclemencias del tiempo, por favor llame a Lacy Duverney principal al [phone] para una actualizacin sobre el [place] de cualquier retraso o cierre.  Consejos para la medicacin en dermatologa: Por favor, guarde las cajas en las que vienen los medicamentos de uso tpico para ayudarle a seguir las instrucciones sobre dnde y cmo usarlos. Las farmacias generalmente imprimen las instrucciones del medicamento slo en las cajas y no directamente en los tubos del [place].   Si su medicamento es muy caro, por favor, pngase en contacto con Rolm Gala llamando al [phone] y presione la opcin 4 o envenos un mensaje a travs de Clinical cytogeneticist.   No podemos decirle cul ser su copago por los medicamentos por adelantado ya que esto es diferente dependiendo de la cobertura de su seguro. Sin embargo, es posible que podamos encontrar un medicamento sustituto a Audiological scientist un formulario para que el seguro cubra el medicamento que se considera necesario.   Si se requiere una autorizacin previa para que su compaa de seguros [place] su medicamento, por favor permtanos de 1 a 2 das hbiles para completar [address]  Los precios de los medicamentos varan con frecuencia dependiendo del Environmental consultant de dnde se surte la receta y alguna farmacias pueden ofrecer precios ms baratos.  El sitio web www.goodrx.com tiene cupones para medicamentos de Engineer, civil (consulting). Los precios aqu no tienen en cuenta lo que podra costar con la ayuda del seguro (puede ser ms barato con su seguro), pero el sitio web puede darle el precio si no utiliz Tourist information centre manager.  - Puede imprimir el cupn correspondiente y llevarlo con su receta a la farmacia.  - Tambin puede pasar por nuestra oficina durante el horario de atencin regular y Education officer, museum una tarjeta de cupones de GoodRx.  - Si  necesita que su receta se enve electrnicamente a una farmacia diferente, informe a nuestra oficina a travs de MyChart de [place] o por telfono llamando al [phone] y presione la opcin 4.

## 2023-01-12 NOTE — Progress Notes (Signed)
Follow-Up Visit   Subjective  Holly Maynard is a 57 y.o. female who presents for the following: Skin Cancer Screening and Full Body Skin Exam. No personal history of skin cancer or dysplastic nevi. Mother had hx of skin cancer.   The patient presents for Total-Body Skin Exam (TBSE) for skin cancer screening and mole check. The patient has spots, moles and lesions to be evaluated, some may be new or changing and the patient may have concern these could be cancer.    The following portions of the chart were reviewed this encounter and updated as appropriate: medications, allergies, medical history  Review of Systems:  No other skin or systemic complaints except as noted in HPI or Assessment and Plan.  Objective  Well appearing patient in no apparent distress; mood and affect are within normal limits.  A full examination was performed including scalp, head, eyes, ears, nose, lips, neck, chest, axillae, abdomen, back, buttocks, bilateral upper extremities, bilateral lower extremities, hands, feet, fingers, toes, fingernails, and toenails. All findings within normal limits unless otherwise noted below.   Relevant physical exam findings are noted in the Assessment and Plan.  Left Posterior shoulder 0.6 cm irregular brown macule       Left Lower Back 7 cm lateral to spine 1.1 cm irregular brown macule 7 cm lateral to spine         Assessment & Plan   SKIN CANCER SCREENING PERFORMED TODAY.  FAMILY HISTORY OF SKIN CANCER What type(s): Melanoma Who affected: Mother   ACTINIC DAMAGE - Chronic condition, secondary to cumulative UV/sun exposure - diffuse scaly erythematous macules with underlying dyspigmentation - Recommend daily broad spectrum sunscreen SPF 30+ to sun-exposed areas, reapply every 2 hours as needed.  - Staying in the shade or wearing long sleeves, sun glasses (UVA+UVB protection) and wide brim hats (4-inch brim around the entire circumference of the hat) are  also recommended for sun protection.  - Call for new or changing lesions.  LENTIGINES, SEBORRHEIC KERATOSES, HEMANGIOMAS - Benign normal skin lesions - Benign-appearing - Call for any changes  MELANOCYTIC NEVI - Tan-brown and/or pink-flesh-colored symmetric macules and papules - Benign appearing on exam today - Observation - Call clinic for new or changing moles - Recommend daily use of broad spectrum spf 30+ sunscreen to sun-exposed areas.       Neoplasm of skin (2) Left Posterior shoulder  Epidermal / dermal shaving  Lesion diameter (cm):  0.6 Informed consent: discussed and consent obtained   Timeout: patient name, date of birth, surgical site, and procedure verified   Procedure prep:  Patient was prepped and draped in usual sterile fashion Prep type:  Isopropyl alcohol Anesthesia: the lesion was anesthetized in a standard fashion   Anesthetic:  1% lidocaine w/ epinephrine 1-100,000 buffered w/ 8.4% NaHCO3 Instrument used: flexible razor blade   Hemostasis achieved with: pressure, aluminum chloride and electrodesiccation   Outcome: patient tolerated procedure well   Post-procedure details: sterile dressing applied and wound care instructions given   Dressing type: bandage and petrolatum    Specimen 1 - Surgical pathology Differential Diagnosis: Nevus, R/O dysplastic nevus  Check Margins: No  Left Lower Back 7 cm lateral to spine  Epidermal / dermal shaving  Lesion diameter (cm):  1.1 Informed consent: discussed and consent obtained   Timeout: patient name, date of birth, surgical site, and procedure verified   Procedure prep:  Patient was prepped and draped in usual sterile fashion Prep type:  Isopropyl alcohol Anesthesia: the lesion  was anesthetized in a standard fashion   Anesthetic:  1% lidocaine w/ epinephrine 1-100,000 buffered w/ 8.4% NaHCO3 Instrument used: flexible razor blade   Hemostasis achieved with: pressure, aluminum chloride and  electrodesiccation   Outcome: patient tolerated procedure well   Post-procedure details: sterile dressing applied and wound care instructions given   Dressing type: bandage and petrolatum    Specimen 2 - Surgical pathology Differential Diagnosis: Nevus, R/O dysplastic nevus   Check Margins: No   Return in about 1 year (around 01/12/2024) for TBSE.  I, Lawson Radar, CMA, am acting as scribe for Armida Sans, MD.   Documentation: I have reviewed the above documentation for accuracy and completeness, and I agree with the above.  Armida Sans, MD

## 2023-01-14 ENCOUNTER — Other Ambulatory Visit: Payer: Self-pay | Admitting: Family Medicine

## 2023-01-14 DIAGNOSIS — I10 Essential (primary) hypertension: Secondary | ICD-10-CM

## 2023-01-14 MED ORDER — INDAPAMIDE 1.25 MG PO TABS
1.2500 mg | ORAL_TABLET | Freq: Every day | ORAL | 0 refills | Status: DC
Start: 2023-01-14 — End: 2023-02-13

## 2023-01-18 ENCOUNTER — Encounter: Payer: Self-pay | Admitting: Dermatology

## 2023-01-19 ENCOUNTER — Telehealth: Payer: Self-pay

## 2023-01-19 NOTE — Telephone Encounter (Signed)
Patient informed of pathology results. Appointment scheduled.

## 2023-01-19 NOTE — Telephone Encounter (Signed)
-----   Message from Armida Sans sent at 01/15/2023  5:51 PM EDT ----- Diagnosis 1. 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 Skin , left lower back 7 cm lateral to spine DYSPLASTIC COMPOUND NEVUS WITH MODERATE ATYPIA, PERIPHERAL MARGIN INVOLVED  1- Severe dysplastic Margins clear, but "close to margin" May need additional procedure Recheck next visit 2- Moderate dysplastic  Recheck next visit  Recommend 6 mos follow up.  Make pt 6 mos appt.

## 2023-02-13 ENCOUNTER — Other Ambulatory Visit: Payer: Self-pay | Admitting: Family Medicine

## 2023-02-13 DIAGNOSIS — I10 Essential (primary) hypertension: Secondary | ICD-10-CM

## 2023-02-13 MED ORDER — INDAPAMIDE 1.25 MG PO TABS
1.2500 mg | ORAL_TABLET | Freq: Every day | ORAL | 0 refills | Status: DC
Start: 2023-02-13 — End: 2023-03-16

## 2023-02-17 DIAGNOSIS — Z23 Encounter for immunization: Secondary | ICD-10-CM | POA: Diagnosis not present

## 2023-02-19 ENCOUNTER — Ambulatory Visit: Payer: BC Managed Care – PPO | Admitting: Family Medicine

## 2023-02-19 VITALS — Ht 69.0 in | Wt 239.2 lb

## 2023-02-19 DIAGNOSIS — R7303 Prediabetes: Secondary | ICD-10-CM | POA: Diagnosis not present

## 2023-02-19 DIAGNOSIS — E669 Obesity, unspecified: Secondary | ICD-10-CM

## 2023-02-19 NOTE — Progress Notes (Signed)
Medical Nutrition Therapy Appt start time: 1330 end time: 1430 (1 hour) Primary concerns today: Weight management.  PCP Dana Allan, MD; [facility], [place], [facility] [address]   Relevant history/background: Holly Maynard was seen by me for MNT many years ago for anorexia nervosa, which resolved in her 30's.  Holly Maynard gained weight during her last job with [employer] while also helping care for her mom, who had cancer.  She then gained ~80 lb in the 5 months after her mom died, so is interested in weight management (06-27-21: wt 298 lb; BMI 40; ht 69").  Other diagnoses include prediabetes and HTN.  Follows a lacto-ovo vegetarian diet.     Assessment:  Holly Maynard has continued consistent efforts on her behavioral goals, including documenting progress on goals (see below).  BP continues to be elevated, despite dietary changes and weight loss.  She has discontinued vit D supplement, and is waking less at night, having made an effort to resist  getting up at every inclination.   11/12/22 labs: mostly in normal range with exceptions of HDL 37.1 mg/dL and Vit D 75.64 ng/mL.  A1c was 5.8%, down from 6.1 on 05/02/21.    Weight: 239.2 lb; 247.8 lb on 12/25/22 (down from 285 on 04/21/22).  Ht is 69".   Usual eating pattern: 3 meals and 0-1 snack per day. BP checks (home): 2 days ago: 159/95.  Usual physical activity: Walks 30-40 min 5-6 X wk (with some fast intervals intersperse), ~35 min PT exercises for L shoulder and stretching and light weights for upper body, 1-3 X wk.   Sleep: Estimates 7-9 hrs per night.  Getting up less frequently.   Progression goals:  Veg's:   Meeting goal (11 meals/wk). Walking:  Meeting goal.  Protein:  Averaging 64 g protein/day.  24-hr recall:  (Up at 8:30 AM) B (9 AM)-  2/3 c Kind cereal (12 g pro), 1 c soy milk, 1 banana, water Snk ( AM)-  1 c blk coffee L (2 PM)-  1 large salad, 1 c cot chs, 1 tbsp lite drsng, 1 potato, diet Coke   Snk ( PM)-  water D (6:30 PM)-  1  large salad, 1 c cot chs, 1 tbsp lite drsng, 1 c homemade veg soup, 1+ c mixed fruit, diet Coke,  Snk (8 PM)-  1 yogurt bar (4 g pro, 80 kcal)  Typical day? Yes.     Nutritional Diagnosis: Excellent progress noted on NI-5.8.3 Inappropriate intake of types of carbohydrates (specify):   As related to inadequate vegetable intake.  As evidenced by meeting or surpassing behavioral goals over the past 8 weeks.   Handouts given during visit include: After-Visit Summary (AVS) Revised Goals Sheet  Monitoring/Evaluation:  Dietary intake, exercise, and body weight in 4 week(s).

## 2023-02-19 NOTE — Patient Instructions (Signed)
Reminder: Look into [employer].    Track your daily sodium intake for a full week.  (Aim for <1500 mg/day.)    Behavioral Goals:  1. Have twice the volume of veg's as protein or starch foods in at least 10 meals per week.  2. Limit sodium intake to 1500 mg/day.    3. Obtain 20-25 grams of protein per meal.       - A weight of 175 lb = ~80 kg.  The protein RDA = 0.8 X body weight in kg, but there is increasing evidence that 1 g/kg is better for individuals >60 YO.       - Protein focus: Check food labels for most protein sources.   1 egg or 1 ounce of meat, fish, poultry, or cheese provides about 7 grams of protein.  8 oz of soy milk = ~6-7 g protein; 2 tbsp peanut butter provides 8 grams of protein. 1 cup (8 oz) of most beans provides 15 grams of protein.  If using beans as your protein source for that meal, always have at least one full cup.   Yogurts vary greatly, but [place] yogurt is highest in protein.    Document progress on the above goals using your Goals Sheet.     Follow-up appt on Tuesday, October 22 at 1:30 PM.

## 2023-03-09 ENCOUNTER — Ambulatory Visit: Payer: BC Managed Care – PPO | Admitting: Family Medicine

## 2023-03-09 ENCOUNTER — Encounter: Payer: Self-pay | Admitting: Family Medicine

## 2023-03-09 VITALS — BP 120/80 | HR 74 | Temp 97.9°F | Resp 16 | Ht 69.25 in | Wt 238.1 lb

## 2023-03-09 DIAGNOSIS — Z6834 Body mass index (BMI) 34.0-34.9, adult: Secondary | ICD-10-CM | POA: Diagnosis not present

## 2023-03-09 DIAGNOSIS — I10 Essential (primary) hypertension: Secondary | ICD-10-CM

## 2023-03-09 MED ORDER — AMLODIPINE BESYLATE 10 MG PO TABS: 10.0000 mg | ORAL_TABLET | Freq: Every day | ORAL | 0 refills | Status: DC

## 2023-03-09 NOTE — Assessment & Plan Note (Signed)
BMI elevated with comorbidities of hypertension and hyperlipidemia Activity has increased, weight continues to decrease Continue to follow up with RD and to continue current activity

## 2023-03-09 NOTE — Progress Notes (Signed)
SUBJECTIVE:   Chief Complaint  Patient presents with   Hypertension   HPI Presents to clinic for concern for elevated blood pressure  Declined use of AI for office visit.  BP readings at [facility] range 159-161/90-95.  Denies any  headaches, shortness of breath, chest pain, heart palpitations. Weight loss of 60 lbs. Otherwise feels good overall.   Compliant with Amlodipine 5 mg at night and Indapamide 1.25 mg daily and tolerating well  PERTINENT PMH / PSH: As above  OBJECTIVE:  BP 120/80   Pulse 74   Temp 97.9 F (36.6 C)   Resp 16   Ht 5' 9.25" (1.759 m)   Wt 238 lb 2 oz (108 kg)   LMP  (LMP Unknown)   SpO2 97%   BMI 34.91 kg/m    Physical Exam Vitals reviewed.  Constitutional:      General: She is not in acute distress.    Appearance: Normal appearance. She is normal weight. She is not ill-appearing, toxic-appearing or diaphoretic.  Eyes:     General:        Right eye: No discharge.        Left eye: No discharge.     Conjunctiva/sclera: Conjunctivae normal.  Cardiovascular:     Rate and Rhythm: Normal rate and regular rhythm.     Heart sounds: Normal heart sounds.  Pulmonary:     Effort: Pulmonary effort is normal.     Breath sounds: Normal breath sounds.  Abdominal:     General: Bowel sounds are normal.  Musculoskeletal:        General: Normal range of motion.  Skin:    General: Skin is warm and dry.  Neurological:     General: No focal deficit present.     Mental Status: She is alert and oriented to person, place, and time. Mental status is at baseline.  Psychiatric:        Mood and Affect: Mood normal.        Behavior: Behavior normal.        Thought Content: Thought content normal.        Judgment: Judgment normal.        03/09/2023    8:56 AM 12/15/2022    3:56 PM 10/29/2022    1:07 PM 04/25/2021    8:06 AM  Depression screen PHQ 2/9  Decreased Interest 0 0 0 0  Down, Depressed, Hopeless 0 0 0 0  PHQ - 2 Score 0 0 0 0  Altered  sleeping 0     Tired, decreased energy 0     Change in appetite 0     Feeling bad or failure about yourself  0     Trouble concentrating 0     Moving slowly or fidgety/restless 0     Suicidal thoughts 0     PHQ-9 Score 0     Difficult doing work/chores Not difficult at all         03/09/2023    8:56 AM 12/15/2022    3:56 PM 10/29/2022    1:07 PM  GAD 7 : Generalized Anxiety Score  Nervous, Anxious, on Edge 0 0 0  Control/stop worrying 0 0 0  Worry too much - different things 0 0 0  Trouble relaxing 0 0 0  Restless 0 0 0  Easily annoyed or irritable 0 0 0  Afraid - awful might happen 0 0 0  Total GAD 7 Score 0 0 0  Anxiety Difficulty Not difficult  at all      ASSESSMENT/PLAN:  Primary hypertension Assessment & Plan: Chronic. Repeat with BP machine 144/93 vs manual 120/80 Increase Amlodipine 10 mg at night Continue Indapamide 1.25 mg daily Continue lifestyle changes and activity Monitor BP at home.  Goal <150/90    Orders: -     amLODIPine Besylate; Take 1 tablet (10 mg total) by mouth daily.  Dispense: 90 tablet; Refill: 0  Morbid obesity (HCC) Assessment & Plan: BMI elevated with comorbidities of hypertension and hyperlipidemia Activity has increased, weight continues to decrease Continue to follow up with RD and to continue current activity        PDMP reviewed  Return in about 2 weeks (around 03/23/2023) for RN clinic, blood pressure,.  Dana Allan, MD

## 2023-03-09 NOTE — Patient Instructions (Addendum)
It was a pleasure meeting you today. Thank you for allowing me to take part in your health care.  Our goals for today as we discussed include:  Congratulations on your weight loss journey!!!  Increase Amlodipine to 10 mg at night Continue Indapamide 1.25 mg daily Continue to monitor blood pressure at home.  Goal <150/90 If any symptoms of headaches, dizziness, chest pain, pressure, palpitations please notify MD or have someone take you to Urgent Care/ Emergency.  Follow up in nurse clinic in 2 weeks for blood pressure check   This is a list of the screening recommended for you and due dates:  Health Maintenance  Topic Date Due   Mammogram  Never done   Zoster (Shingles) Vaccine (1 of 2) Never done   Pap with HPV screening  Never done   Colon Cancer Screening  Never done   Flu Shot  12/25/2022   COVID-19 Vaccine (5 - 2023-24 season) 01/25/2023   DTaP/Tdap/Td vaccine (2 - Td or Tdap) 10/28/2032   HPV Vaccine  Aged Out   Hepatitis C Screening  Discontinued   Cologuard (Stool DNA test)  Discontinued   HIV Screening  Discontinued      If you have any questions or concerns, please do not hesitate to call the office at [phone].  I look forward to our next visit and until then take care and stay safe.  Regards,   Dana Allan, MD   [facility]

## 2023-03-09 NOTE — Assessment & Plan Note (Addendum)
Chronic. Repeat with BP machine 144/93 vs manual 120/80 Increase Amlodipine 10 mg at night Continue Indapamide 1.25 mg daily Continue lifestyle changes and activity Monitor BP at home.  Goal <150/90

## 2023-03-16 ENCOUNTER — Other Ambulatory Visit: Payer: Self-pay | Admitting: Family Medicine

## 2023-03-16 DIAGNOSIS — I10 Essential (primary) hypertension: Secondary | ICD-10-CM

## 2023-03-17 ENCOUNTER — Ambulatory Visit: Payer: BC Managed Care – PPO | Admitting: Family Medicine

## 2023-03-17 VITALS — Ht 69.0 in | Wt 236.0 lb

## 2023-03-17 DIAGNOSIS — E66811 Obesity, class 1: Secondary | ICD-10-CM

## 2023-03-17 DIAGNOSIS — R7303 Prediabetes: Secondary | ICD-10-CM | POA: Diagnosis not present

## 2023-03-17 MED ORDER — INDAPAMIDE 1.25 MG PO TABS: 1.2500 mg | ORAL_TABLET | Freq: Every day | ORAL | 0 refills | Status: DC

## 2023-03-17 NOTE — Progress Notes (Signed)
Medical Nutrition Therapy Appt start time: 1330 end time: 1430 (1 hour) Primary concerns today: Weight management.  PCP Dana Allan, MD; [facility], [place], [facility] [address]   Relevant history/background: Garyn was seen by me for MNT many years ago for anorexia nervosa, which resolved in her 30's.  Malaia gained weight during her last job with [employer] while also helping care for her mom, who had cancer.  She then gained ~80 lb in the 5 months after her mom died, so is interested in weight management (06-27-21: wt 298 lb; BMI 40; ht 69").  Other diagnoses include prediabetes and HTN.  Follows a lacto-ovo vegetarian diet.     Assessment:  Elenah has continued her excellent progress on behavioral goals.  See below.   BP checks have indicated normal range, but she will see Dr. Clent Ridges again next month for a re-check.   11/12/22 labs: Mostly in normal range with exceptions of HDL 37.1 mg/dL and Vit D 16.10 ng/mL.  A1c was 5.8%, down from 6.1 on 05/02/21.    Weight: 236.0 lb; 239.2 lb on 02/19/23 (down from 285 on 04/21/22).  Ht is 69".   Usual eating pattern: 3 meals and 0-1 snack per day. BP checks (home): Last manual check at the clinic was 120/80.   Usual physical activity: Walking 30-40 min 5-6 X wk (with some fast intervals), plus ~35 min PT exercises for L shoulder and stretching and light weights for upper body, 1-3 X wk.   Sleep: Estimates 7-9 hrs per night.   Na intake: 1575 to 2154 mg Progression goals:  Veg's:   10-11 meals/wk with twice volume of veg's vs. pro/starch. Walking:  Meeting goal.  Protein:  Averaging >60 g protein/day.  24-hr recall:  (Up at 10 AM) B (10:45 AM)-  1 c blk coffee, water, 2/3 c kind bar cereal, 1 c soy milk, 1 c blueberries Snk ( AM)-  --- L (1:30 PM)-  1 c stir-fried tofu, 1 c broccoli, 1 c cooked carrots, 1 baked potato, diet Coke Snk ( PM)-  --- D (7 PM)-  1 large salad, 1 c blk beans, 1/2 c grated mozz, 2 tbsp dressing, 1 banana, diet  Coke Snk (8:30)-  100-kcal [employer], diet Coke Typical day? Yes.     Nutritional Diagnosis: Continued progress on NI-5.8.3 Inappropriate intake of types of carbohydrates (specify):   As related to inadequate vegetable intake.  As evidenced by meeting or surpassing behavioral goals over the past month.   Handouts given during visit include: After-Visit Summary (AVS) Revised Goals Sheet  Monitoring/Evaluation:  Dietary intake, exercise, and body weight in 4 week(s).

## 2023-03-17 NOTE — Patient Instructions (Addendum)
Recommendation: Experiment with some homemade salad dressings.  For example, you can use yogurt as a base, and try some of the flavored vinegars or lemon or lime.  Another use for dressings as we head into winter would be on a grain salad.  To make it even  easier, you can use frozen vegetables.    Behavioral Goals:  1. Have twice the volume of veg's as protein or starch foods in at least 10 meals per week.  2. Limit sodium intake to 1500 mg/day.  Document intake for the next couple (few) of weeks.   3. Obtain 20-25 grams of protein per meal.       - A weight of 175 lb = ~80 kg.  The protein RDA = 0.8 X body weight in kg, but there is increasing evidence that 1 g/kg is better for individuals >60 YO.       - Protein focus: Check food labels for most protein sources.   1 egg or 1 ounce of meat, fish, poultry, or cheese provides about 7 grams of protein.  8 oz of soy milk = ~6-7 g protein; 2 tbsp peanut butter provides 8 grams of protein. 1 cup (8 oz) of most beans provides 15 grams of protein.  If using beans as your protein source for that meal, always have at least one full cup.   Yogurts vary greatly, but [place] yogurt is highest in protein.     Document progress on the above goals using your Goals Sheet.     Follow-up appt on Tuesday, Nov 19 at 1:30 (and Dec 17 at 2 PM).

## 2023-03-23 ENCOUNTER — Ambulatory Visit (INDEPENDENT_AMBULATORY_CARE_PROVIDER_SITE_OTHER): Payer: BC Managed Care – PPO

## 2023-03-23 VITALS — BP 130/84 | HR 79

## 2023-03-23 DIAGNOSIS — I10 Essential (primary) hypertension: Secondary | ICD-10-CM

## 2023-03-23 NOTE — Progress Notes (Cosign Needed Addendum)
Pt presented for a BP check. Pt was identified through two identifiers. After going over med list pt confrimed she takes amlodipine  &  lozol    Pt denies: Chest pain, SOB, Dizziness, Headaches, nausea, blurred vision, and numbness or tingling,.     BP after 6 minutes of rest:  BP: 130/84 P: 79   Due to BP being within normal limits and pt asymptomatic, I let pt head to check out.

## 2023-03-27 DIAGNOSIS — Z23 Encounter for immunization: Secondary | ICD-10-CM | POA: Diagnosis not present

## 2023-04-13 NOTE — Progress Notes (Unsigned)
Medical Nutrition Therapy Appt start time: 1330 end time: 1430 (1 hour) Primary concerns today: Weight management.  PCP Dana Allan, MD; [facility], , [facility] [address]   Relevant history/background: Holly Maynard was seen by me for MNT many years ago for anorexia nervosa, which resolved in her 30's.  Holly Maynard gained weight during her last job with [employer] while also helping care for her mom, who had cancer.  She then gained ~80 lb in the 5 months after her mom died, so is interested in weight management (06-27-21: wt 298 lb; BMI 40; ht 69").  Other diagnoses include prediabetes and HTN.  Follows a lacto-ovo vegetarian diet.     Assessment:  Holly Maynard's BP checks have continued to be within normal range, including in-office checks.  She has identified her erratic sleep schedule as something she'd like to work on.    Weight: 232.4 lb; 236.0 lb on 03/17/23 (down from 285 on 04/21/22).  Ht is 69".   Usual eating pattern: 3 meals and 0-1 snack per day. BP: On 10/28, check at clinic was 130/84.  Home BP checks have been similar.  Usual physical activity: Walking 30-40 min 5-6 X wk (with some fast intervals), plus 35 min PT exercises for L shoulder and stretching and light weights for upper body, 1-2 X wk.   Sleep: Estimates 7-9 hrs per night, often broken, but falling back to sleep after ~15 min.  Has fallen off of consistent sleep schedule.  Bedtime range has been 10 PM to 2 AM, and sometimes sleeping till noon.    Progress on goals:  Veg's:   9-10 meals/wk with twice volume of veg's vs. pro/starch. <1500 mg Na: Averaging <1500 mg/day.  Protein:  Averaging >60 g protein/day.  Nutritional Diagnosis: Continued progress on NI-5.8.3 Inappropriate intake of types of carbohydrates (specify):   As related to inadequate vegetable intake.  As evidenced by meeting or surpassing behavioral goals over the past month.   Handouts given during visit include: After-Visit Summary (AVS) Revised Goals  Sheet  Monitoring/Evaluation:  Dietary intake, exercise, and body weight  as needed at [facility].   Marland Kitchen

## 2023-04-14 ENCOUNTER — Ambulatory Visit: Payer: BC Managed Care – PPO | Admitting: Family Medicine

## 2023-04-14 VITALS — Ht 69.0 in | Wt 232.4 lb

## 2023-04-14 DIAGNOSIS — E66811 Obesity, class 1: Secondary | ICD-10-CM | POA: Diagnosis not present

## 2023-04-14 DIAGNOSIS — R7303 Prediabetes: Secondary | ICD-10-CM

## 2023-04-14 NOTE — Patient Instructions (Addendum)
-   Make a list of factors detrimental to good health (and to on the lookout for):   e.g., excessive work hours, too much stress, inadequate sleep, breaking healthy routines.    Place this list somewhere you will access it easily, and schedule a time for reviewing it in the future.    Recommendation: Try some homemade salad dressings; try some new soups now that it's soup season.     Behavioral Goals:  1. Get to bed no later than 11:30 PM, and get up consistently at 8 AM.        - Set a phone alarm for the start of bedtime routine (shut off phone, TV, lights; PJs; reading material) no later than 11 PM.       - Document bedtimes and getting up times.       - Each evening, plan a first-thing-in-the-morning activity to help make getting out of bed more appealing.       - Place your alarm in a place where you have to get out of bed to shut it off.   2. Limit sodium intake to 1500 mg/day.   3. Obtain 20-25 grams of protein per meal.       - A weight of 175 lb = ~80 kg.  The protein RDA = 0.8 X body weight in kg, but there is increasing evidence that 1 g/kg is better for individuals >60 YO.       - Protein focus: Check food labels for most protein sources.   1 egg or 1 ounce of meat, fish, poultry, or cheese provides about 7 grams of protein.  8 oz of soy milk = ~6-7 g protein; 2 tbsp peanut butter provides 8 grams of protein. 1 cup (8 oz) of most beans provides 15 grams of protein.  If using beans as your protein source for that meal, always have at least one full cup.   Yogurts vary greatly, but [place] yogurt is highest in protein.     Document progress on the above goals using your Goals Sheet.     Follow-up as needed:  [facility] at [facility], [address] (4th floor).  They will call you to schedule an appt after receiving a physician referral.

## 2023-04-20 ENCOUNTER — Other Ambulatory Visit: Payer: Self-pay | Admitting: Family Medicine

## 2023-04-20 DIAGNOSIS — I10 Essential (primary) hypertension: Secondary | ICD-10-CM

## 2023-04-22 MED ORDER — INDAPAMIDE 1.25 MG PO TABS: 1.2500 mg | ORAL_TABLET | Freq: Every day | ORAL | 0 refills | Status: DC

## 2023-05-12 ENCOUNTER — Ambulatory Visit: Payer: BC Managed Care – PPO | Admitting: Family Medicine

## 2023-05-24 ENCOUNTER — Other Ambulatory Visit: Payer: Self-pay | Admitting: Family Medicine

## 2023-05-24 DIAGNOSIS — I10 Essential (primary) hypertension: Secondary | ICD-10-CM

## 2023-05-25 MED ORDER — INDAPAMIDE 1.25 MG PO TABS: 1.2500 mg | ORAL_TABLET | Freq: Every day | ORAL | 0 refills | Status: DC

## 2023-06-14 ENCOUNTER — Other Ambulatory Visit: Payer: Self-pay | Admitting: Family Medicine

## 2023-06-14 DIAGNOSIS — I10 Essential (primary) hypertension: Secondary | ICD-10-CM

## 2023-06-22 ENCOUNTER — Other Ambulatory Visit: Payer: Self-pay | Admitting: Family Medicine

## 2023-06-22 DIAGNOSIS — I10 Essential (primary) hypertension: Secondary | ICD-10-CM

## 2023-06-22 MED ORDER — AMLODIPINE BESYLATE 10 MG PO TABS: 10.0000 mg | ORAL_TABLET | Freq: Every day | ORAL | 0 refills | Status: DC

## 2023-07-09 ENCOUNTER — Other Ambulatory Visit: Payer: Self-pay | Admitting: Family Medicine

## 2023-07-09 DIAGNOSIS — I10 Essential (primary) hypertension: Secondary | ICD-10-CM

## 2023-07-10 MED ORDER — INDAPAMIDE 1.25 MG PO TABS: 1.2500 mg | ORAL_TABLET | Freq: Every day | ORAL | 0 refills | Status: DC

## 2023-07-22 ENCOUNTER — Ambulatory Visit: Payer: BC Managed Care – PPO | Admitting: Dermatology

## 2023-08-14 IMAGING — DX DG SHOULDER 2+V*L*
3 series · 3 of 3 positions shown · non-contrast
Comparison: None.

CLINICAL DATA: Left shoulder pain for 2 years, no known injury,
initial encounter

EXAM:
LEFT SHOULDER - 2+ VIEW

[shoulder (grashey) ap]
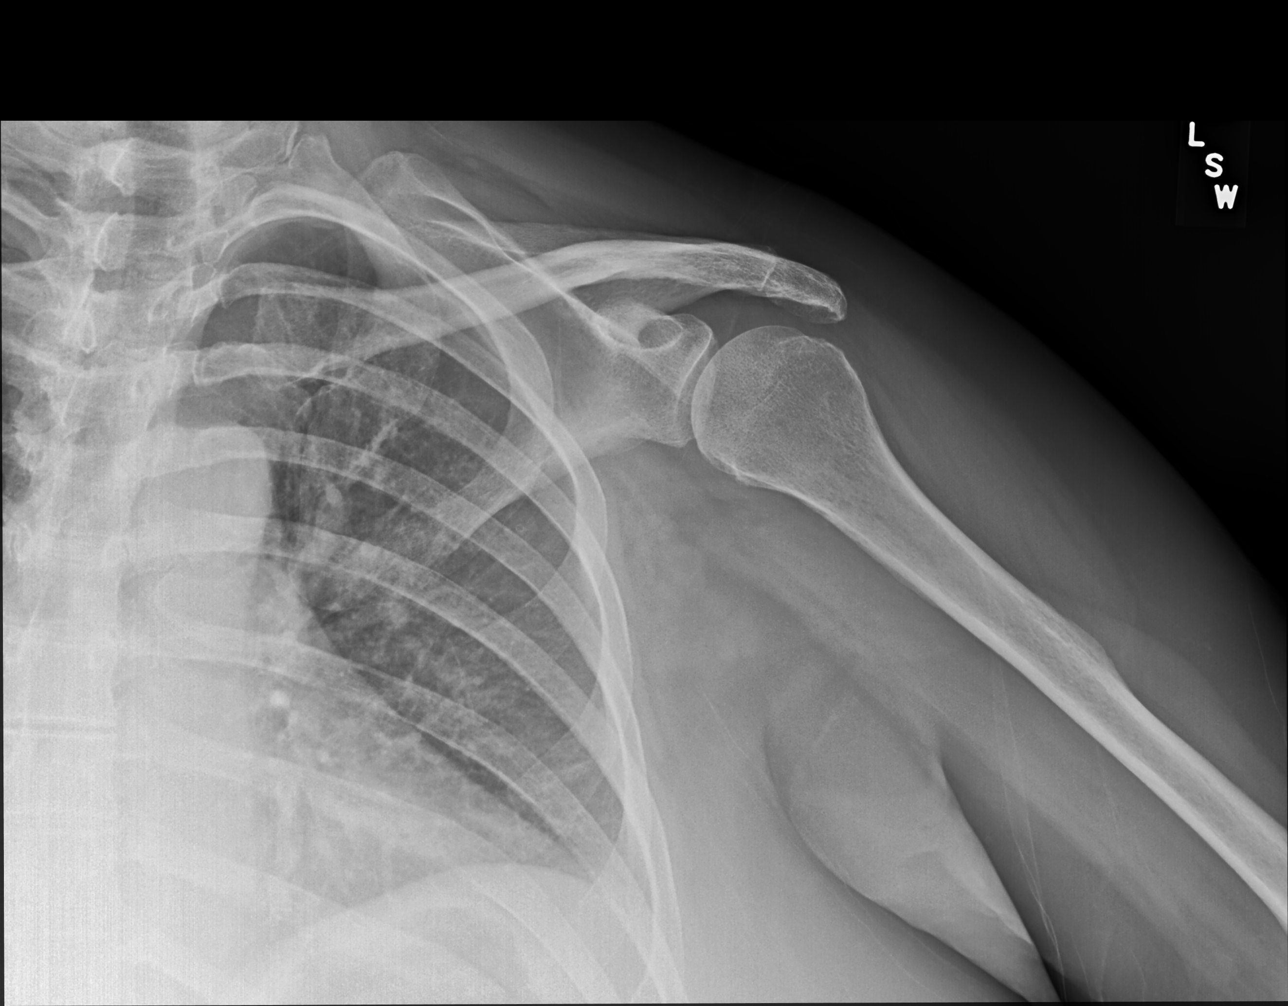

[shoulder y view]
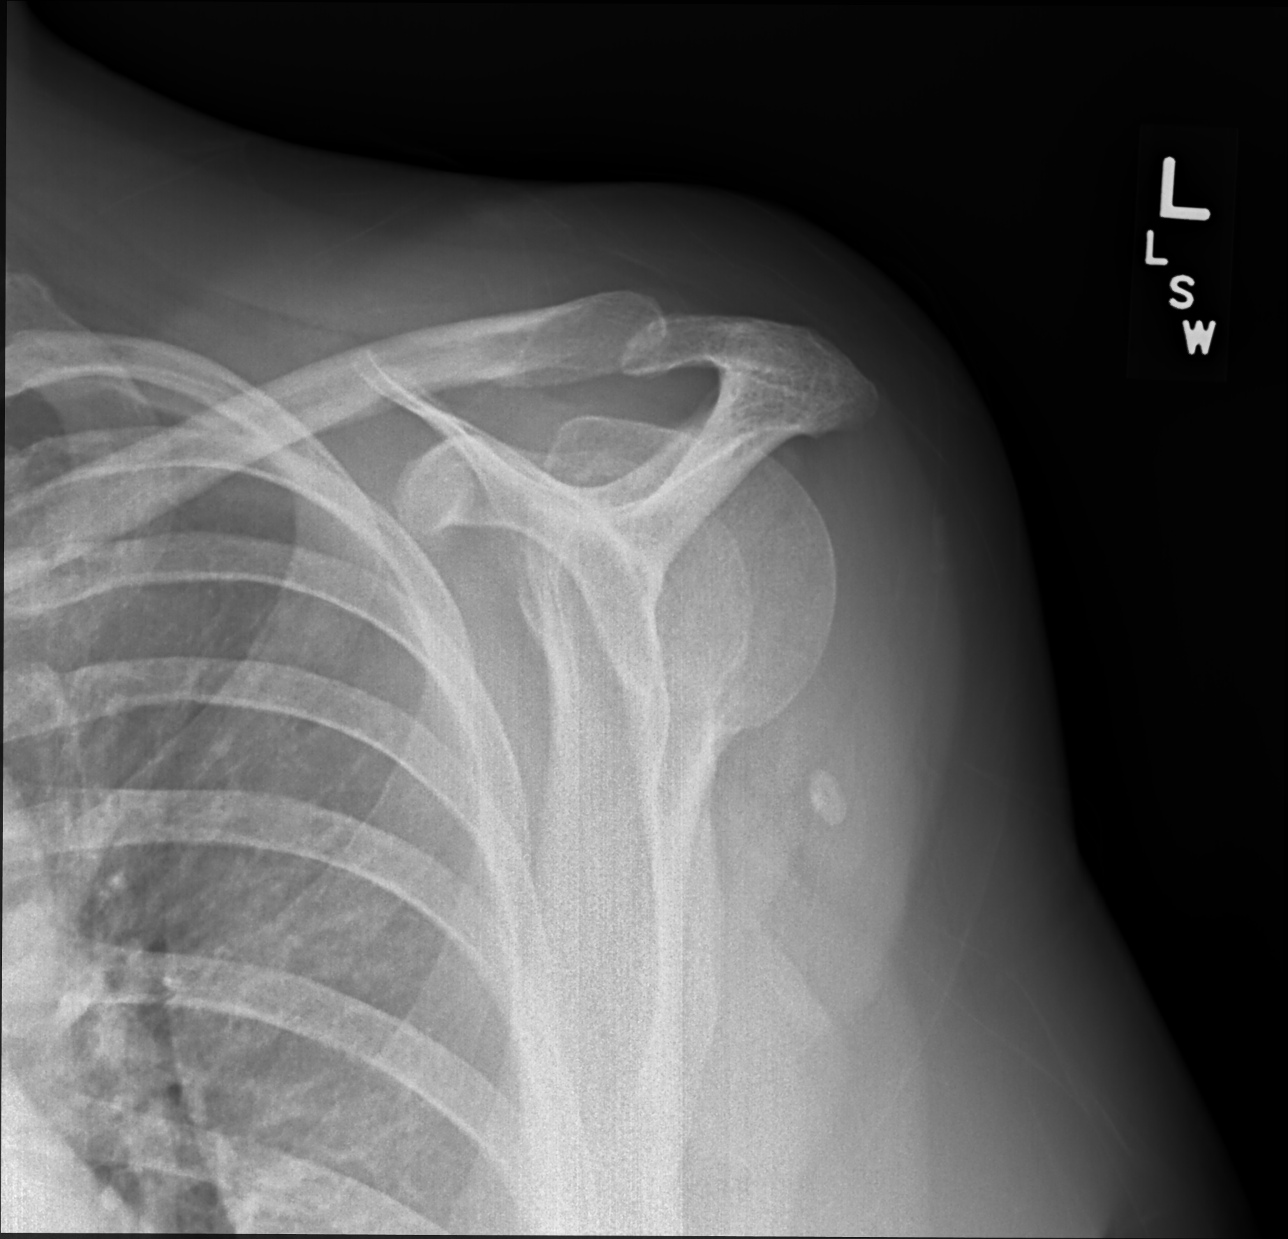

[shoulder axillary pa]
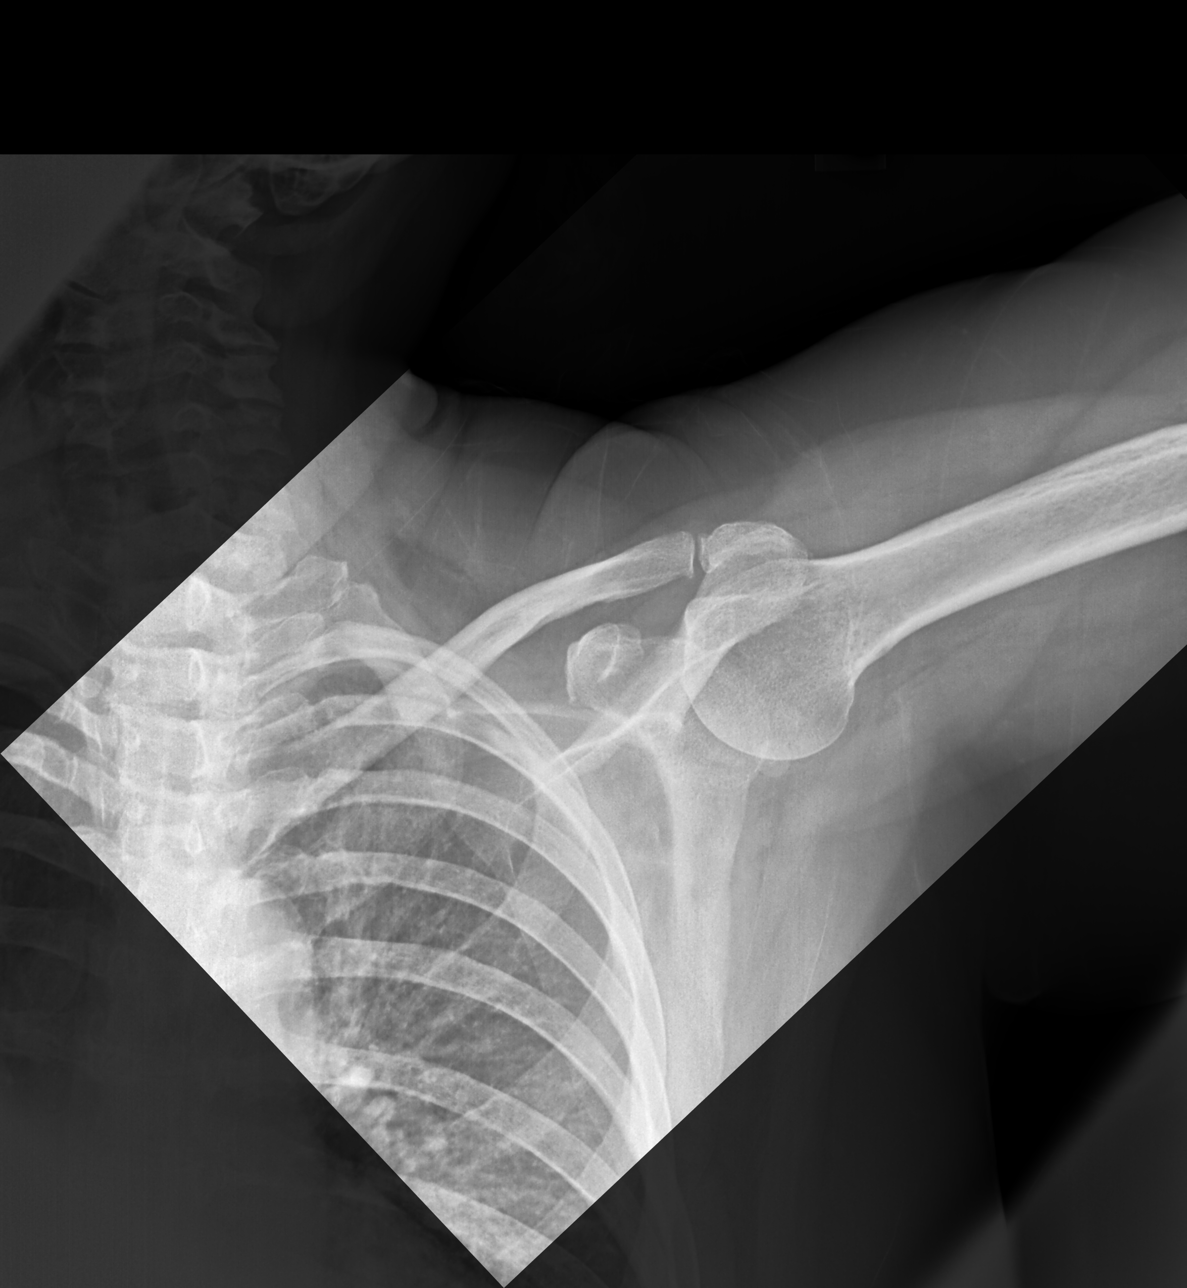

[3 of 3 positions shown; findings below may reference images not displayed]

FINDINGS: There is no evidence of fracture or dislocation. Mild degenerative
changes of the acromioclavicular joint are noted.
IMPRESSION: Mild degenerative change without acute abnormality.

## 2023-08-17 ENCOUNTER — Other Ambulatory Visit: Payer: Self-pay | Admitting: Family Medicine

## 2023-08-17 DIAGNOSIS — I10 Essential (primary) hypertension: Secondary | ICD-10-CM

## 2023-08-18 MED ORDER — INDAPAMIDE 1.25 MG PO TABS: 1.2500 mg | ORAL_TABLET | Freq: Every day | ORAL | 0 refills | Status: DC

## 2023-09-24 ENCOUNTER — Other Ambulatory Visit: Payer: Self-pay | Admitting: Family Medicine

## 2023-09-24 DIAGNOSIS — I10 Essential (primary) hypertension: Secondary | ICD-10-CM

## 2023-09-25 MED ORDER — INDAPAMIDE 1.25 MG PO TABS: 1.2500 mg | ORAL_TABLET | Freq: Every day | ORAL | 0 refills | Status: DC

## 2023-10-06 ENCOUNTER — Other Ambulatory Visit: Payer: Self-pay | Admitting: Family Medicine

## 2023-10-06 DIAGNOSIS — I10 Essential (primary) hypertension: Secondary | ICD-10-CM

## 2023-10-06 MED ORDER — AMLODIPINE BESYLATE 10 MG PO TABS: 10.0000 mg | ORAL_TABLET | Freq: Every day | ORAL | 0 refills | Status: DC

## 2024-01-18 ENCOUNTER — Other Ambulatory Visit: Payer: Self-pay | Admitting: Nurse Practitioner

## 2024-01-18 DIAGNOSIS — I10 Essential (primary) hypertension: Secondary | ICD-10-CM

## 2024-01-18 MED ORDER — AMLODIPINE BESYLATE 10 MG PO TABS: 10.0000 mg | ORAL_TABLET | Freq: Every day | ORAL | 0 refills | Status: AC

## 2024-01-18 MED ORDER — INDAPAMIDE 1.25 MG PO TABS: 1.2500 mg | ORAL_TABLET | Freq: Every day | ORAL | 0 refills | Status: AC

## 2024-01-18 NOTE — Telephone Encounter (Signed)
 Copied from CRM #[id number]. Topic: Clinical - Medication Refill >> Jan 18, 2024  1:43 PM Robinson H wrote: Medication: amLODipine  (NORVASC ) 10 MG tablet, indapamide  (LOZOL ) 1.25 MG tablet  Has the patient contacted their pharmacy? No, usually refill online but Epic wouldn't let her (Agent: If no, request that the patient contact the pharmacy for the refill. If patient does not wish to contact the pharmacy document the reason why and proceed with request.) (Agent: If yes, when and what did the pharmacy advise?)  This is the patient's preferred pharmacy:  [id number] [place], [place] - [address] AT [facility] OF [address] [place] [address] Phone: [phone] Fax: [phone]  Is this the correct pharmacy for this prescription? Yes If no, delete pharmacy and type the correct one.   Has the prescription been filled recently? No  Is the patient out of the medication? Yes  Has the patient been seen for an appointment in the last year OR does the patient have an upcoming appointment? Yes, patient has a TOC appointment but not until 05/2024  Can we respond through MyChart? Yes  Agent: Please be advised that Rx refills may take up to 3 business days. We ask that you follow-up with your pharmacy.

## 2024-01-28 ENCOUNTER — Ambulatory Visit: Payer: BC Managed Care – PPO | Admitting: Dermatology

## 2024-02-22 ENCOUNTER — Ambulatory Visit: Admitting: Dermatology

## 2024-04-13 ENCOUNTER — Telehealth: Payer: Self-pay | Admitting: Nurse Practitioner

## 2024-04-13 NOTE — Telephone Encounter (Signed)
 I left voicemail for patient asking her to please call us  back.  I also sent a message to patient via MyChart.  E2C2 - when patient returns our call, please let her know that we apologize, as we were given an incorrect date earlier today.  Please let her know that her appointment with Leron Glance, FNP-C, on 05/27/2024 at 8:20am, has been changed to 05/23/2024 at 8:20am, due to provider being out of the office.  If this date/time does not work for patient, please assist her with rescheduling her appointment.

## 2024-04-13 NOTE — Telephone Encounter (Signed)
 Lm and sent MyChart message. 05/27/24 appointment has been changed to 05/16/2024 @ 8:20 am.

## 2024-05-03 ENCOUNTER — Ambulatory Visit: Admitting: Dermatology

## 2024-05-23 ENCOUNTER — Encounter: Payer: Self-pay | Admitting: Nurse Practitioner

## 2024-05-27 ENCOUNTER — Encounter: Payer: Self-pay | Admitting: Nurse Practitioner

## 2024-08-24 ENCOUNTER — Encounter: Payer: Self-pay | Admitting: Nurse Practitioner
